# Patient Record
Sex: Male | Born: 1960 | Race: Black or African American | Hispanic: No | Marital: Married
Health system: Midwestern US, Academic
[De-identification: ages and names within clinical notes are randomized; demographics above are authoritative.]

## PROBLEM LIST (undated history)

## (undated) DIAGNOSIS — C140 Malignant neoplasm of pharynx, unspecified: Secondary | ICD-10-CM

## (undated) DIAGNOSIS — K4090 Unilateral inguinal hernia, without obstruction or gangrene, not specified as recurrent: Secondary | ICD-10-CM

## (undated) HISTORY — PX: APPENDECTOMY: SHX54

## (undated) HISTORY — PX: GASTROSTOMY W/ FEEDING TUBE: SUR642

## (undated) HISTORY — PX: OTHER SURGICAL HISTORY: SHX169

---

## 2018-05-01 NOTE — Pre-Procedure Instructions (Signed)
Ian Richard  05/01/2018      [place] [address] Phone: [phone] Fax: [phone]    Your procedure is scheduled on Friday March 13th.  Report to [facility] Admitting at 9:50 A.M.  Call this number if you have problems the morning of surgery:  [phone]   Remember:  Do not eat or drink after midnight.     Take these medicines the morning of surgery with A SIP OF WATER  acetaminophen (TYLENOL)  7 days prior to surgery STOP taking any Aspirin(unless otherwise instructed by your surgeon), Aleve, Naproxen, Ibuprofen, Motrin, Advil, Goody's, BC's, all herbal medications, fish oil, and all vitamins     Do not wear jewelry  Do not wear lotions, powders, or colognes, or deodorant.  Men may shave face and neck.  Do not bring valuables to the hospital.  [facility] is not responsible for any belongings or valuables.  Contacts, dentures or bridgework may not be worn into surgery.  Leave your suitcase in the car.  After surgery it may be brought to your room.  For patients admitted to the hospital, discharge time will be determined by your treatment team.  Patients discharged the day of surgery will not be allowed to drive home.   [employer]- Preparing For Surgery  Before surgery, you can play an important role. Because skin is not sterile, your skin needs to be as free of germs as possible. You can reduce the number of germs on your skin by washing with CHG (chlorahexidine gluconate) Soap before surgery.  CHG is an antiseptic cleaner which kills germs and bonds with the skin to continue killing germs even after washing.    Oral Hygiene is also important to reduce your risk of infection.  Remember - BRUSH YOUR TEETH THE MORNING OF SURGERY WITH YOUR REGULAR TOOTHPASTE  Please do not use if you have an allergy to CHG or antibacterial soaps. If your skin becomes  reddened/irritated stop using the CHG.  Do not shave (including legs and underarms) for at least 48 hours prior to first CHG shower. It is OK to shave your face.  Please follow these instructions carefully.   1. Shower the NIGHT BEFORE SURGERY and the MORNING OF SURGERY with CHG.   2. If you chose to wash your hair, wash your hair first as usual with your normal shampoo.  3. After you shampoo, rinse your hair and body thoroughly to remove the shampoo.  4. Use CHG as you would any other liquid soap. You can apply CHG directly to the skin and wash gently with a scrungie or a clean washcloth.   5. Apply the CHG Soap to your body ONLY FROM THE NECK DOWN.  Do not use on open wounds or open sores. Avoid contact with your eyes, ears, mouth and genitals (private parts). Wash Face and genitals (private parts)  with your normal soap.  6. Wash thoroughly, paying special attention to the area where your surgery will be performed.  7. Thoroughly rinse your body with warm water from the neck down.  8. DO NOT shower/wash with your normal soap after using and rinsing off the CHG Soap.  9. Pat yourself dry with a CLEAN TOWEL.  10. Wear CLEAN PAJAMAS to bed the night before surgery, wear comfortable clothes the morning of surgery  11. Place CLEAN SHEETS on your bed the night of your first  shower and DO NOT SLEEP WITH PETS.    Day of Surgery: Shower as stated above. Do not apply any deodorants/lotions.  Please wear clean clothes to the hospital/surgery center.   Remember to brush your teeth WITH YOUR REGULAR TOOTHPASTE.   Please read over the following fact sheets that you were given.

## 2018-05-02 ENCOUNTER — Encounter (HOSPITAL_COMMUNITY)
Admission: RE | Admit: 2018-05-02 | Discharge: 2018-05-02 | Disposition: A | Discharge status: Home / Self Care | Payer: Medicaid Other | Source: Ambulatory Visit | Attending: Oral Surgery | Admitting: Oral Surgery

## 2018-05-02 ENCOUNTER — Other Ambulatory Visit: Payer: Self-pay

## 2018-05-02 ENCOUNTER — Encounter (HOSPITAL_COMMUNITY): Payer: Self-pay

## 2018-05-02 DIAGNOSIS — Z01812 Encounter for preprocedural laboratory examination: Secondary | ICD-10-CM | POA: Diagnosis not present

## 2018-05-02 HISTORY — DX: Malignant neoplasm of pharynx, unspecified: C14.0

## 2018-05-02 HISTORY — DX: Unilateral inguinal hernia, without obstruction or gangrene, not specified as recurrent: K40.90

## 2018-05-02 LAB — CBC WITH DIFFERENTIAL/PLATELET
Abs Immature Granulocytes: 0.03 10*3/uL (ref 0.00–0.07)
Basophils Absolute: 0 10*3/uL (ref 0.0–0.1)
Basophils Relative: 0 %
Eosinophils Absolute: 0 10*3/uL (ref 0.0–0.5)
Eosinophils Relative: 0 %
HCT: 36.6 % — ABNORMAL LOW (ref 39.0–52.0)
Hemoglobin: 12.1 g/dL — ABNORMAL LOW (ref 13.0–17.0)
Immature Granulocytes: 0 %
Lymphocytes Relative: 14 %
Lymphs Abs: 1.7 10*3/uL (ref 0.7–4.0)
MCH: 30.9 pg (ref 26.0–34.0)
MCHC: 33.1 g/dL (ref 30.0–36.0)
MCV: 93.4 fL (ref 80.0–100.0)
Monocytes Absolute: 1.2 10*3/uL — ABNORMAL HIGH (ref 0.1–1.0)
Monocytes Relative: 10 %
Neutro Abs: 8.7 10*3/uL — ABNORMAL HIGH (ref 1.7–7.7)
Neutrophils Relative %: 76 %
Platelets: 451 10*3/uL — ABNORMAL HIGH (ref 150–400)
RBC: 3.92 MIL/uL — ABNORMAL LOW (ref 4.22–5.81)
RDW: 13.5 % (ref 11.5–15.5)
WBC: 11.6 10*3/uL — ABNORMAL HIGH (ref 4.0–10.5)
nRBC: 0 % (ref 0.0–0.2)

## 2018-05-02 LAB — COMPREHENSIVE METABOLIC PANEL
ALT: 14 U/L (ref 0–44)
AST: 19 U/L (ref 15–41)
Albumin: 3 g/dL — ABNORMAL LOW (ref 3.5–5.0)
Alkaline Phosphatase: 65 U/L (ref 38–126)
Anion gap: 8 (ref 5–15)
BUN: 15 mg/dL (ref 6–20)
CO2: 29 mmol/L (ref 22–32)
Calcium: 9.4 mg/dL (ref 8.9–10.3)
Chloride: 96 mmol/L — ABNORMAL LOW (ref 98–111)
Creatinine, Ser: 0.54 mg/dL — ABNORMAL LOW (ref 0.61–1.24)
GFR calc Af Amer: >60 mL/min (ref >60)
Glucose, Bld: 89 mg/dL (ref 70–99)
Potassium: 4 mmol/L (ref 3.5–5.1)
Sodium: 133 mmol/L — ABNORMAL LOW (ref 135–145)
Total Bilirubin: 0.2 mg/dL — ABNORMAL LOW (ref 0.3–1.2)
Total Protein: 7.5 g/dL (ref 6.5–8.1)

## 2018-05-02 LAB — PROTIME-INR
INR: 1.1 (ref 0.8–1.2)
Prothrombin Time: 13.7 seconds (ref 11.4–15.2)

## 2018-05-02 NOTE — Progress Notes (Signed)
PCP - NO ONE AT THE PRESENT  ENT - Dr. Pershing Proud, ENT  Cardiologist - DENIES  Chest x-ray - N/A EKG - N/A Stress Test - N/A ECHO - N/A Cardiac Cath - N/A  Sleep Study - NA/ CPAP -   Fasting Blood Sugar - N/A Checks Blood Sugar _____ times a day  Blood Thinner Instructions: N/A Aspirin Instructions:  Anesthesia review: NO  Patient denies shortness of breath, fever, cough and chest pain at PAT appointment   Patient verbalized understanding of instructions that were given to them at the PAT appointment. Patient was also instructed that they will need to review over the PAT instructions again at home before surgery.

## 2018-05-03 ENCOUNTER — Encounter (HOSPITAL_COMMUNITY)
Admission: RE | Disposition: A | Discharge status: Home / Self Care | Payer: Self-pay | Source: Home / Self Care | Attending: Oral Surgery

## 2018-05-03 ENCOUNTER — Ambulatory Visit (HOSPITAL_COMMUNITY): Payer: Medicaid Other | Admitting: Certified Registered Nurse Anesthetist

## 2018-05-03 ENCOUNTER — Encounter (HOSPITAL_COMMUNITY): Payer: Self-pay

## 2018-05-03 ENCOUNTER — Ambulatory Visit (HOSPITAL_COMMUNITY)
Admission: RE | Admit: 2018-05-03 | Discharge: 2018-05-03 | Disposition: A | Discharge status: Home / Self Care | Payer: Medicaid Other | Attending: Oral Surgery | Admitting: Oral Surgery

## 2018-05-03 DIAGNOSIS — C140 Malignant neoplasm of pharynx, unspecified: Secondary | ICD-10-CM | POA: Diagnosis not present

## 2018-05-03 DIAGNOSIS — K029 Dental caries, unspecified: Secondary | ICD-10-CM | POA: Insufficient documentation

## 2018-05-03 DIAGNOSIS — Z91030 Bee allergy status: Secondary | ICD-10-CM | POA: Diagnosis not present

## 2018-05-03 HISTORY — PX: TOOTH EXTRACTION: SHX859

## 2018-05-03 SURGERY — DENTAL RESTORATION/EXTRACTIONS
Anesthesia: General | Site: Mouth | Laterality: Bilateral

## 2018-05-03 MED ORDER — LACTATED RINGERS IV SOLN
INTRAVENOUS | Status: DC
  Administered 2018-05-03 (×2): via INTRAVENOUS

## 2018-05-03 MED ORDER — MIDAZOLAM HCL 2 MG/2ML IJ SOLN
INTRAMUSCULAR | Status: DC | PRN
  Administered 2018-05-03 (×2): 1 mg via INTRAVENOUS

## 2018-05-03 MED ORDER — OXYCODONE HCL 5 MG/5ML PO SOLN: 5.0000 mg | ORAL | 0 refills | Status: AC | PRN

## 2018-05-03 MED ORDER — HYDROMORPHONE HCL 1 MG/ML IJ SOLN
0.2500 mg | INTRAMUSCULAR | Status: DC | PRN
  Administered 2018-05-03: 0.5 mg via INTRAVENOUS

## 2018-05-03 MED ORDER — FENTANYL CITRATE (PF) 250 MCG/5ML IJ SOLN
INTRAMUSCULAR | Status: AC
  Filled 2018-05-03: qty 5

## 2018-05-03 MED ORDER — SUCCINYLCHOLINE CHLORIDE 200 MG/10ML IV SOSY
PREFILLED_SYRINGE | INTRAVENOUS | Status: AC
  Filled 2018-05-03: qty 30

## 2018-05-03 MED ORDER — LIDOCAINE 2% (20 MG/ML) 5 ML SYRINGE
INTRAMUSCULAR | Status: AC
  Filled 2018-05-03: qty 5

## 2018-05-03 MED ORDER — PROPOFOL 10 MG/ML IV BOLUS
INTRAVENOUS | Status: DC | PRN
  Administered 2018-05-03: 130 mg via INTRAVENOUS

## 2018-05-03 MED ORDER — ONDANSETRON HCL 4 MG/2ML IJ SOLN
INTRAMUSCULAR | Status: AC
  Filled 2018-05-03: qty 2

## 2018-05-03 MED ORDER — SODIUM CHLORIDE 0.9 % IV SOLN
INTRAVENOUS | Status: AC | PRN
  Administered 2018-05-03: 1000 mL

## 2018-05-03 MED ORDER — PHENYLEPHRINE 40 MCG/ML (10ML) SYRINGE FOR IV PUSH (FOR BLOOD PRESSURE SUPPORT)
PREFILLED_SYRINGE | INTRAVENOUS | Status: AC
  Filled 2018-05-03: qty 30

## 2018-05-03 MED ORDER — SUGAMMADEX SODIUM 200 MG/2ML IV SOLN
INTRAVENOUS | Status: DC | PRN
  Administered 2018-05-03: 200 mg via INTRAVENOUS

## 2018-05-03 MED ORDER — PHENYLEPHRINE 40 MCG/ML (10ML) SYRINGE FOR IV PUSH (FOR BLOOD PRESSURE SUPPORT)
PREFILLED_SYRINGE | INTRAVENOUS | Status: DC | PRN
  Administered 2018-05-03: 40 ug via INTRAVENOUS
  Administered 2018-05-03 (×2): 120 ug via INTRAVENOUS
  Administered 2018-05-03: 40 ug via INTRAVENOUS

## 2018-05-03 MED ORDER — CEFAZOLIN SODIUM-DEXTROSE 2-4 GM/100ML-% IV SOLN
2.0000 g | INTRAVENOUS | Status: AC
  Administered 2018-05-03: 2 g via INTRAVENOUS

## 2018-05-03 MED ORDER — 0.9 % SODIUM CHLORIDE (POUR BTL) OPTIME
TOPICAL | Status: DC | PRN
  Administered 2018-05-03: 1000 mL

## 2018-05-03 MED ORDER — CEFAZOLIN SODIUM-DEXTROSE 2-4 GM/100ML-% IV SOLN
INTRAVENOUS | Status: AC
  Filled 2018-05-03: qty 100

## 2018-05-03 MED ORDER — EPHEDRINE 5 MG/ML INJ
INTRAVENOUS | Status: AC
  Filled 2018-05-03: qty 10

## 2018-05-03 MED ORDER — ONDANSETRON HCL 4 MG/2ML IJ SOLN
INTRAMUSCULAR | Status: DC | PRN
  Administered 2018-05-03: 4 mg via INTRAVENOUS

## 2018-05-03 MED ORDER — DEXAMETHASONE SODIUM PHOSPHATE 10 MG/ML IJ SOLN
INTRAMUSCULAR | Status: AC
  Filled 2018-05-03: qty 1

## 2018-05-03 MED ORDER — HYDROMORPHONE HCL 1 MG/ML IJ SOLN
INTRAMUSCULAR | Status: AC
  Filled 2018-05-03: qty 1

## 2018-05-03 MED ORDER — HYDROCODONE-ACETAMINOPHEN 7.5-325 MG PO TABS: 1.0000 | ORAL_TABLET | Freq: Once | ORAL | Status: DC | PRN

## 2018-05-03 MED ORDER — PROPOFOL 10 MG/ML IV BOLUS
INTRAVENOUS | Status: AC
  Filled 2018-05-03: qty 20

## 2018-05-03 MED ORDER — MEPERIDINE HCL 50 MG/ML IJ SOLN: 6.2500 mg | INTRAMUSCULAR | Status: DC | PRN

## 2018-05-03 MED ORDER — AMOXICILLIN 400 MG/5ML PO SUSR: 400.0000 mg | Freq: Two times a day (BID) | ORAL | 0 refills | Status: AC

## 2018-05-03 MED ORDER — ROCURONIUM BROMIDE 10 MG/ML (PF) SYRINGE
PREFILLED_SYRINGE | INTRAVENOUS | Status: DC | PRN
  Administered 2018-05-03: 30 mg via INTRAVENOUS

## 2018-05-03 MED ORDER — DEXAMETHASONE SODIUM PHOSPHATE 10 MG/ML IJ SOLN
INTRAMUSCULAR | Status: DC | PRN
  Administered 2018-05-03: 10 mg via INTRAVENOUS

## 2018-05-03 MED ORDER — FENTANYL CITRATE (PF) 250 MCG/5ML IJ SOLN
INTRAMUSCULAR | Status: DC | PRN
  Administered 2018-05-03: 50 ug via INTRAVENOUS

## 2018-05-03 MED ORDER — LIDOCAINE-EPINEPHRINE 2 %-1:100000 IJ SOLN
INTRAMUSCULAR | Status: DC | PRN
  Administered 2018-05-03: 16 mL

## 2018-05-03 MED ORDER — LIDOCAINE 2% (20 MG/ML) 5 ML SYRINGE
INTRAMUSCULAR | Status: DC | PRN
  Administered 2018-05-03: 100 mg via INTRAVENOUS

## 2018-05-03 MED ORDER — OXYCODONE-ACETAMINOPHEN 5-325 MG/5ML PO SOLN: 5.0000 mL | ORAL | 0 refills | Status: AC | PRN

## 2018-05-03 MED ORDER — ACETAMINOPHEN 10 MG/ML IV SOLN: 1000.0000 mg | Freq: Once | INTRAVENOUS | Status: DC | PRN

## 2018-05-03 MED ORDER — AMOXICILLIN 500 MG PO CAPS: 500.0000 mg | ORAL_CAPSULE | Freq: Three times a day (TID) | ORAL | 0 refills | Status: AC

## 2018-05-03 MED ORDER — EPHEDRINE SULFATE 50 MG/ML IJ SOLN
INTRAMUSCULAR | Status: DC | PRN
  Administered 2018-05-03: 10 mg via INTRAVENOUS

## 2018-05-03 MED ORDER — MIDAZOLAM HCL 2 MG/2ML IJ SOLN
INTRAMUSCULAR | Status: AC
  Filled 2018-05-03: qty 2

## 2018-05-03 MED ORDER — LIDOCAINE-EPINEPHRINE 2 %-1:100000 IJ SOLN
INTRAMUSCULAR | Status: AC
  Filled 2018-05-03: qty 1

## 2018-05-03 MED ORDER — PROMETHAZINE HCL 25 MG/ML IJ SOLN: 6.2500 mg | INTRAMUSCULAR | Status: DC | PRN

## 2018-05-03 MED ORDER — OXYCODONE-ACETAMINOPHEN 5-325 MG PO TABS: 1.0000 | ORAL_TABLET | Freq: Four times a day (QID) | ORAL | 0 refills | Status: DC | PRN

## 2018-05-03 SURGICAL SUPPLY — 40 items
BLADE 15 SAFETY STRL DISP (BLADE) ×3 IMPLANT
BLADE SURG 15 STRL LF DISP TIS (BLADE) ×1 IMPLANT
BLADE SURG 15 STRL SS (BLADE) ×2
BUR CROSS CUT FISSURE 1.6 (BURR) ×2 IMPLANT
BUR CROSS CUT FISSURE 1.6MM (BURR) ×1
BUR EGG ELITE 4.0 (BURR) ×2 IMPLANT
BUR EGG ELITE 4.0MM (BURR) ×1
CANISTER SUCT 3000ML PPV (MISCELLANEOUS) ×3 IMPLANT
COVER SURGICAL LIGHT HANDLE (MISCELLANEOUS) ×3 IMPLANT
COVER WAND RF STERILE (DRAPES) ×3 IMPLANT
DECANTER SPIKE VIAL GLASS SM (MISCELLANEOUS) ×3 IMPLANT
DRAPE U-SHAPE 76X120 STRL (DRAPES) ×3 IMPLANT
GAUZE PACKING FOLDED 2  STR (GAUZE/BANDAGES/DRESSINGS) ×2
GAUZE PACKING FOLDED 2 STR (GAUZE/BANDAGES/DRESSINGS) ×1 IMPLANT
GLOVE BIO SURGEON STRL SZ 6.5 (GLOVE) IMPLANT
GLOVE BIO SURGEON STRL SZ7 (GLOVE) IMPLANT
GLOVE BIO SURGEON STRL SZ7.5 (GLOVE) ×3 IMPLANT
GLOVE BIO SURGEONS STRL SZ 6.5 (GLOVE)
GLOVE BIOGEL PI IND STRL 6.5 (GLOVE) IMPLANT
GLOVE BIOGEL PI IND STRL 7.0 (GLOVE) IMPLANT
GLOVE BIOGEL PI INDICATOR 6.5 (GLOVE)
GLOVE BIOGEL PI INDICATOR 7.0 (GLOVE)
GOWN STRL REUS W/ TWL LRG LVL3 (GOWN DISPOSABLE) ×1 IMPLANT
GOWN STRL REUS W/ TWL XL LVL3 (GOWN DISPOSABLE) ×1 IMPLANT
GOWN STRL REUS W/TWL LRG LVL3 (GOWN DISPOSABLE) ×2
GOWN STRL REUS W/TWL XL LVL3 (GOWN DISPOSABLE) ×2
IV NS 1000ML (IV SOLUTION) ×2
IV NS 1000ML BAXH (IV SOLUTION) ×1 IMPLANT
KIT BASIN OR (CUSTOM PROCEDURE TRAY) ×3 IMPLANT
KIT TURNOVER KIT B (KITS) ×3 IMPLANT
NEEDLE 22X1 1/2 (OR ONLY) (NEEDLE) ×6 IMPLANT
NS IRRIG 1000ML POUR BTL (IV SOLUTION) ×3 IMPLANT
PAD ARMBOARD 7.5X6 YLW CONV (MISCELLANEOUS) ×3 IMPLANT
SLEEVE IRRIGATION ELITE 7 (MISCELLANEOUS) ×3 IMPLANT
SPONGE SURGIFOAM ABS GEL 12-7 (HEMOSTASIS) IMPLANT
SUT CHROMIC 3 0 PS 2 (SUTURE) ×3 IMPLANT
SYR CONTROL 10ML LL (SYRINGE) ×3 IMPLANT
TRAY ENT MC OR (CUSTOM PROCEDURE TRAY) ×3 IMPLANT
TUBING IRRIGATION (MISCELLANEOUS) ×3 IMPLANT
YANKAUER SUCT BULB TIP NO VENT (SUCTIONS) ×3 IMPLANT

## 2018-05-03 NOTE — Anesthesia Procedure Notes (Signed)
Procedure Name: Intubation Date/Time: 05/03/2018 12:28 PM Performed by: Kyung Rudd, CRNA Pre-anesthesia Checklist: Patient identified, Emergency Drugs available, Suction available and Patient being monitored Patient Re-evaluated:Patient Re-evaluated prior to induction Oxygen Delivery Method: Circle system utilized Preoxygenation: Pre-oxygenation with 100% oxygen Induction Type: IV induction Ventilation: Mask ventilation without difficulty and Oral airway inserted - appropriate to patient size Laryngoscope Size: Glidescope and 4 Tube type: Oral Tube size: 7.0 mm Number of attempts: 1 Airway Equipment and Method: Stylet Placement Confirmation: ETT inserted through vocal cords under direct vision,  positive ETCO2 and breath sounds checked- equal and bilateral Secured at: 22 cm Tube secured with: Tape Dental Injury: Teeth and Oropharynx as per pre-operative assessment

## 2018-05-03 NOTE — Anesthesia Preprocedure Evaluation (Signed)
Anesthesia Evaluation  Patient identified by MRN, date of birth, ID band Patient awake    Reviewed: Allergy & Precautions, NPO status , Patient's Chart, lab work & pertinent test results  Airway Mallampati: IV  TM Distance: <3 FB Neck ROM: Full    Dental no notable dental hx. (+) Teeth Intact, Poor Dentition   Pulmonary former smoker,    Pulmonary exam normal breath sounds clear to auscultation       Cardiovascular negative cardio ROS Normal cardiovascular exam Rhythm:Regular Rate:Normal     Neuro/Psych    GI/Hepatic Neg liver ROS,   Endo/Other    Renal/GU      Musculoskeletal   Abdominal   Peds  Hematology   Anesthesia Other Findings   Reproductive/Obstetrics                            Lab Results  Component Value Date   WBC 11.6 (H) 05/02/2018   HGB 12.1 (L) 05/02/2018   HCT 36.6 (L) 05/02/2018   MCV 93.4 05/02/2018   PLT 451 (H) 05/02/2018    Anesthesia Physical Anesthesia Plan  ASA: III  Anesthesia Plan: General   Post-op Pain Management:    Induction: Intravenous  PONV Risk Score and Plan: Treatment may vary due to age or medical condition and Ondansetron  Airway Management Planned: Video Laryngoscope Planned and Oral ETT  Additional Equipment:   Intra-op Plan:   Post-operative Plan: Extubation in OR  Informed Consent: I have reviewed the patients History and Physical, chart, labs and discussed the procedure including the risks, benefits and alternatives for the proposed anesthesia with the patient or authorized representative who has indicated his/her understanding and acceptance.     Dental advisory given  Plan Discussed with: CRNA  Anesthesia Plan Comments:         Anesthesia Quick Evaluation

## 2018-05-03 NOTE — Anesthesia Postprocedure Evaluation (Signed)
Anesthesia Post Note  Patient: Ian Richard  Procedure(s) Performed: MULTIPLE EXTRACTIONS (Bilateral Mouth)     Patient location during evaluation: PACU Anesthesia Type: General Level of consciousness: awake and alert Pain management: pain level controlled Vital Signs Assessment: post-procedure vital signs reviewed and stable Respiratory status: spontaneous breathing, nonlabored ventilation and respiratory function stable Cardiovascular status: blood pressure returned to baseline and stable Postop Assessment: no apparent nausea or vomiting Anesthetic complications: no    Last Vitals:  Vitals:   05/03/18 1348 05/03/18 1403  BP: 115/71 102/67  Pulse: 83 73  Resp: 17 13  Temp:    SpO2: 99% 100%    Last Pain:  Vitals:   05/03/18 1403  TempSrc:   PainSc: 6                  Cherylene Ferrufino,W. EDMOND

## 2018-05-03 NOTE — H&P (Signed)
HISTORY AND PHYSICAL  Ian Richard is a 58 y.o. male patient with CC: referred by general dentist for dental extractions prior to radiation treatment for throat cancer.  No diagnosis found.  Past Medical History:  Diagnosis Date  . Left inguinal hernia   . Throat cancer ([place])     No current facility-administered medications for this encounter.    Allergies  Allergen Reactions  . Bee Venom Anaphylaxis  . Hornet Venom Anaphylaxis   Active Problems:   * No active hospital problems. *  Vitals: Blood pressure 120/79, pulse 84, temperature 98.2 F (36.8 C), temperature source Oral, resp. rate 18, height 5\' 8"  (1.727 m), weight 50.8 kg, SpO2 100 %. Lab results: Results for orders placed or performed during the hospital encounter of 05/02/18 (from the past 24 hour(s))  CBC WITH DIFFERENTIAL     Status: Abnormal   Collection Time: 05/02/18  4:11 PM  Result Value Ref Range   WBC 11.6 (H) 4.0 - 10.5 K/uL   RBC 3.92 (L) 4.22 - 5.81 MIL/uL   Hemoglobin 12.1 (L) 13.0 - 17.0 g/dL   HCT 36.6 (L) 39.0 - 52.0 %   MCV 93.4 80.0 - 100.0 fL   MCH 30.9 26.0 - 34.0 pg   MCHC 33.1 30.0 - 36.0 g/dL   RDW 13.5 11.5 - 15.5 %   Platelets 451 (H) 150 - 400 K/uL   nRBC 0.0 0.0 - 0.2 %   Neutrophils Relative % 76 %   Neutro Abs 8.7 (H) 1.7 - 7.7 K/uL   Lymphocytes Relative 14 %   Lymphs Abs 1.7 0.7 - 4.0 K/uL   Monocytes Relative 10 %   Monocytes Absolute 1.2 (H) 0.1 - 1.0 K/uL   Eosinophils Relative 0 %   Eosinophils Absolute 0.0 0.0 - 0.5 K/uL   Basophils Relative 0 %   Basophils Absolute 0.0 0.0 - 0.1 K/uL   Immature Granulocytes 0 %   Abs Immature Granulocytes 0.03 0.00 - 0.07 K/uL  Comprehensive metabolic panel     Status: Abnormal   Collection Time: 05/02/18  4:11 PM  Result Value Ref Range   Sodium 133 (L) 135 - 145 mmol/L   Potassium 4.0 3.5 - 5.1 mmol/L   Chloride 96 (L) 98 - 111 mmol/L   CO2 29 22 - 32 mmol/L   Glucose, Bld 89 70 - 99 mg/dL   BUN 15 6 - 20 mg/dL   Creatinine,  Ser 0.54 (L) 0.61 - 1.24 mg/dL   Calcium 9.4 8.9 - 10.3 mg/dL   Total Protein 7.5 6.5 - 8.1 g/dL   Albumin 3.0 (L) 3.5 - 5.0 g/dL   AST 19 15 - 41 U/L   ALT 14 0 - 44 U/L   Alkaline Phosphatase 65 38 - 126 U/L   Total Bilirubin 0.2 (L) 0.3 - 1.2 mg/dL   GFR calc non Af Amer >60 >60 mL/min   GFR calc Af Amer >60 >60 mL/min   Anion gap 8 5 - 15  Protime-INR     Status: None   Collection Time: 05/02/18  4:11 PM  Result Value Ref Range   Prothrombin Time 13.7 11.4 - 15.2 seconds   INR 1.1 0.8 - 1.2   Radiology Results: No results found. General appearance: alert, cooperative and no distress Head: Normocephalic, without obvious abnormality, atraumatic Eyes: negative Nose: Nares normal. Septum midline. Mucosa normal. No drainage or sinus tenderness. Throat: multiple dental caries maxillary teeth # 1-7, 9-16. impacted root # 17. No purulence,  fluctuance. Mixed red/white lesion posterior pharynx.  Neck: no adenopathy, supple, symmetrical, trachea midline and thyroid not enlarged, symmetric, no tenderness/mass/nodules Resp: clear to auscultation bilaterally Cardio: regular rate and rhythm, S1, S2 normal, no murmur, click, rub or gallop  Assessment:57  Yo Male with throat cancer, multiple non-restorable teeth. Needs extractions prior to radiation therapy of cancer.   Plan: Extract 1-7, 9-17. GA. Day surgery.    Diona Browner 05/03/2018

## 2018-05-03 NOTE — Op Note (Signed)
NAME: Ian Richard, Ian Richard MEDICAL RECORD PI:[id number] ACCOUNT [id number] DATE OF BIRTH:1961/02/20 FACILITY: [facility] LOCATION: [facility] PHYSICIAN:Doreen Garretson M. Ahmere Hemenway, DDS  OPERATIVE REPORT  DATE OF PROCEDURE:  05/03/2018  PREOPERATIVE DIAGNOSIS:  Nonrestorable teeth numbers 1, 2, 3, 4, 5, 6, 7, 8, 9, 10, 11, 12, 13, 14, 15, 16, 17, throat cancer preradiation therapy.  POSTOPERATIVE DIAGNOSES:  Nonrestorable teeth numbers 1, 2, 3, 4, 5, 6, 7, 8, 9, 10, 11, 12, 13, 14, 15, 16, 17, throat cancer preradiation therapy procedures.  No tooth 1 present.    PROCEDURE:  Extractions of teeth numbers 1, 2, 3, 4, 5, 6, 7, 9, 10, 11, 12, 13, 14, 15, 16, 17, alveoplasty right and left maxilla.  SURGEON:  Diona Browner, DDS  ANESTHESIA:  General oral intubation, Dr. Valma Cava attending.  DESCRIPTION OF PROCEDURE:  The patient was taken to the operating room and placed on the table in supine position.  General anesthesia was administered intravenously and an oral endotracheal tube was placed and marked.  The eyes were protected.  The  patient was draped for surgery.  A timeout was performed.  The posterior pharynx was suctioned and a throat pack was placed.  An obvious mass lesion in the posterior pharynx was noted when the throat pack was placed, 2% lidocaine, 1:100,000 epinephrine  was infiltrated in an inferior alveolar block on the left side, and buccal and palatal infiltration in the maxilla around the teeth to be removed.  A total of 19 mL was utilized.  A bite block was placed on the right side of the mouth and a sweetheart  retractor was used to retract the tongue.  A #15 blade was used to make an incision around tooth 17.  The tooth was elevated and removed with the dental forceps.  A portion of the tooth fractured and this required use of a Stryker handpiece with the  drill to remove the root fragment.  Then, the area was irrigated.  The periosteum was released to allow for primary closure and the area was  closed with 3-0 chromic.  In the maxilla, a #15 blade was used to make an incision at the distal aspect of tooth  15 and this incision was carried in the gingival sulcus both buccally and palatally until tooth 9 was reached.  The periosteum was reflected from around these teeth.  Then, the teeth were elevated with a 301 elevator and teeth 16 and 14 and 10 and 9 were  removed with the dental forceps.  Teeth 15, 12, 11 and 13 required removal of additional bone to remove frank portions of the teeth as they broke when attempted to extract with a dental forceps.  After the teeth in the left maxilla were removed, the  periosteum was reflected to expose the alveolar crest.  Alveoplasty was performed using an egg-shaped bur and bone file.  Then, the area was approximated, releasing periosteal incisions were made such that primary closure could be achieved in the left  maxilla was closed with 3-0 chromic.  The endotracheal tube was repositioned to the other side of the mouth as was the bite block and sweetheart retractor then a #15 blade was used to make an incision around teeth numbers 2, 3, 4, 5, 6 and 7.  No tooth 1  was noted.  The periosteum was reflected and the teeth were elevated with a 301 elevator.  Teeth numbers 2 and 3 were removed with the forceps.  Teeth numbers 4, 5 and 6, fractured upon attempted  removal so additional bone was removed circumferentially  around the teeth and the remaining roots so these teeth could be removed.  Tooth 7 was removed with the rongeurs.  The periosteum was reflected to expose the alveolar crest and then alveoplasty was performed using an egg-shaped bur followed by the bone  file.  Then, the area was irrigated.  The periosteum was released and primary closure was achieved using 3-0 chromic.  Then, the oral cavity was irrigated and suctioned.  The patient was left in care of anesthesia for extubation and transport to recovery  room with plans for discharge home through day  surgery.  ESTIMATED BLOOD LOSS:  Minimal.  COMPLICATIONS:  None.  SPECIMENS:  None.  TN/NUANCE  D:05/03/2018 T:05/03/2018 JOB:[id number]

## 2018-05-03 NOTE — Transfer of Care (Signed)
Immediate Anesthesia Transfer of Care Note  Patient: Ian Richard  Procedure(s) Performed: MULTIPLE EXTRACTIONS (Bilateral Mouth)  Patient Location: PACU  Anesthesia Type:General  Level of Consciousness: awake and alert   Airway & Oxygen Therapy: Patient Spontanous Breathing and Patient connected to face mask oxygen  Post-op Assessment: Report given to RN and Post -op Vital signs reviewed and stable  Post vital signs: Reviewed and stable  Last Vitals:  Vitals Value Taken Time  BP 119/76 05/03/2018  1:33 PM  Temp    Pulse 80 05/03/2018  1:36 PM  Resp 15 05/03/2018  1:36 PM  SpO2 100 % 05/03/2018  1:36 PM  Vitals shown include unvalidated device data.  Last Pain:  Vitals:   05/03/18 1053  TempSrc:   PainSc: 0-No pain      Patients Stated Pain Goal: 2 (02/72/53 6644)  Complications: No apparent anesthesia complications

## 2018-05-03 NOTE — Op Note (Signed)
05/03/2018  1:21 PM  PATIENT:  Chanetta Marshall  58 y.o. male  PRE-OPERATIVE DIAGNOSIS:  NON RESTORABLE teeth 1, 2, 3, 4, 5, 6, 7, 9, 10, 11, 12, 13, 14, 15, 16, 17 throat canceer  POST-OPERATIVE DIAGNOSIS:  SAME + no tooth # 1  present  PROCEDURE:  Procedure(s): MULTIPLE EXTRACTIONS  SURGEON:  Surgeon(s): Diona Browner, DDS  ANESTHESIA:   local and general  EBL:  minimal  DRAINS: none   SPECIMEN:  No Specimen  COUNTS:  YES  PLAN OF CARE: Discharge to home after PACU  PATIENT DISPOSITION:  PACU - hemodynamically stable.   PROCEDURE DETAILS: Dictation #[id number]  Gae Bon, DMD 05/03/2018 1:21 PM

## 2018-05-04 ENCOUNTER — Encounter (HOSPITAL_COMMUNITY): Payer: Self-pay | Admitting: Oral Surgery

## 2018-07-06 ENCOUNTER — Encounter (HOSPITAL_BASED_OUTPATIENT_CLINIC_OR_DEPARTMENT_OTHER): Payer: Self-pay | Admitting: Emergency Medicine

## 2018-07-06 ENCOUNTER — Other Ambulatory Visit: Payer: Self-pay

## 2018-07-06 ENCOUNTER — Emergency Department (HOSPITAL_BASED_OUTPATIENT_CLINIC_OR_DEPARTMENT_OTHER)
Admission: EM | Admit: 2018-07-06 | Discharge: 2018-07-07 | Disposition: A | Discharge status: Home / Self Care | Payer: Medicaid Other | Attending: Emergency Medicine | Admitting: Emergency Medicine

## 2018-07-06 ENCOUNTER — Emergency Department (HOSPITAL_BASED_OUTPATIENT_CLINIC_OR_DEPARTMENT_OTHER): Payer: Medicaid Other

## 2018-07-06 DIAGNOSIS — Z87891 Personal history of nicotine dependence: Secondary | ICD-10-CM | POA: Insufficient documentation

## 2018-07-06 DIAGNOSIS — Z79899 Other long term (current) drug therapy: Secondary | ICD-10-CM | POA: Insufficient documentation

## 2018-07-06 DIAGNOSIS — Z931 Gastrostomy status: Secondary | ICD-10-CM | POA: Diagnosis not present

## 2018-07-06 DIAGNOSIS — R2243 Localized swelling, mass and lump, lower limb, bilateral: Secondary | ICD-10-CM | POA: Diagnosis not present

## 2018-07-06 DIAGNOSIS — R600 Localized edema: Secondary | ICD-10-CM

## 2018-07-06 LAB — CBC WITH DIFFERENTIAL/PLATELET
Abs Immature Granulocytes: 0.29 10*3/uL — ABNORMAL HIGH (ref 0.00–0.07)
Basophils Absolute: 0 10*3/uL (ref 0.0–0.1)
Basophils Relative: 0 %
Eosinophils Absolute: 0.1 10*3/uL (ref 0.0–0.5)
Eosinophils Relative: 2 %
HCT: 20.6 % — ABNORMAL LOW (ref 39.0–52.0)
Hemoglobin: 6.6 g/dL — CL (ref 13.0–17.0)
Immature Granulocytes: 4 %
Lymphocytes Relative: 6 %
Lymphs Abs: 0.4 10*3/uL — ABNORMAL LOW (ref 0.7–4.0)
MCH: 29.6 pg (ref 26.0–34.0)
MCHC: 32 g/dL (ref 30.0–36.0)
MCV: 92.4 fL (ref 80.0–100.0)
Monocytes Absolute: 0.8 10*3/uL (ref 0.1–1.0)
Monocytes Relative: 12 %
Neutro Abs: 5.1 10*3/uL (ref 1.7–7.7)
Neutrophils Relative %: 76 %
Platelets: 256 10*3/uL (ref 150–400)
RBC: 2.23 MIL/uL — ABNORMAL LOW (ref 4.22–5.81)
RDW: 14.5 % (ref 11.5–15.5)
WBC: 6.8 10*3/uL (ref 4.0–10.5)
nRBC: 0 % (ref 0.0–0.2)

## 2018-07-06 LAB — COMPREHENSIVE METABOLIC PANEL
ALT: 13 U/L (ref 0–44)
AST: 18 U/L (ref 15–41)
Albumin: 2 g/dL — ABNORMAL LOW (ref 3.5–5.0)
Alkaline Phosphatase: 55 U/L (ref 38–126)
Anion gap: 7 (ref 5–15)
BUN: 9 mg/dL (ref 6–20)
CO2: 21 mmol/L — ABNORMAL LOW (ref 22–32)
Calcium: 7.1 mg/dL — ABNORMAL LOW (ref 8.9–10.3)
Chloride: 107 mmol/L (ref 98–111)
Creatinine, Ser: 0.6 mg/dL — ABNORMAL LOW (ref 0.61–1.24)
GFR calc Af Amer: >60 mL/min (ref >60)
GFR calc non Af Amer: >60 mL/min (ref >60)
Glucose, Bld: 72 mg/dL (ref 70–99)
Potassium: 2.6 mmol/L — CL (ref 3.5–5.1)
Sodium: 135 mmol/L (ref 135–145)
Total Bilirubin: 0.6 mg/dL (ref 0.3–1.2)
Total Protein: 4.7 g/dL — ABNORMAL LOW (ref 6.5–8.1)

## 2018-07-06 LAB — MAGNESIUM: Magnesium: 1.2 mg/dL — ABNORMAL LOW (ref 1.7–2.4)

## 2018-07-06 MED ORDER — GENERIC EXTERNAL MEDICATION
1.00 | Status: DC
Start: 2018-07-06 — End: 2018-07-06

## 2018-07-06 MED ORDER — GLYCOPYRROLATE 0.2 MG/ML IJ SOLN
0.50 | INTRAMUSCULAR | Status: DC
Start: ? — End: 2018-07-06

## 2018-07-06 MED ORDER — GENERIC EXTERNAL MEDICATION
Status: DC
Start: 2018-07-05 — End: 2018-07-06

## 2018-07-06 MED ORDER — POTASSIUM CHLORIDE CRYS ER 20 MEQ PO TBCR: 40.0000 meq | EXTENDED_RELEASE_TABLET | Freq: Once | ORAL | Status: DC

## 2018-07-06 MED ORDER — ONDANSETRON HCL 4 MG/2ML IJ SOLN
4.00 | INTRAMUSCULAR | Status: DC
Start: ? — End: 2018-07-06

## 2018-07-06 MED ORDER — OXYCODONE HCL 5 MG/5ML PO SOLN
10.00 | ORAL | Status: DC
Start: ? — End: 2018-07-06

## 2018-07-06 MED ORDER — HEPARIN SODIUM LOCK FLUSH 100 UNIT/ML IV SOLN
500.00 | INTRAVENOUS | Status: DC
Start: ? — End: 2018-07-06

## 2018-07-06 MED ORDER — GENERIC EXTERNAL MEDICATION
5.00 | Status: DC
Start: 2018-07-05 — End: 2018-07-06

## 2018-07-06 MED ORDER — ACETAMINOPHEN 325 MG PO TABS
650.00 | ORAL_TABLET | ORAL | Status: DC
Start: ? — End: 2018-07-06

## 2018-07-06 MED ORDER — ENOXAPARIN SODIUM 40 MG/0.4ML ~~LOC~~ SOLN
40.00 | SUBCUTANEOUS | Status: DC
Start: 2018-07-05 — End: 2018-07-06

## 2018-07-06 MED ORDER — SODIUM CHLORIDE 1 G PO TABS
1000.00 | ORAL_TABLET | ORAL | Status: DC
Start: 2018-07-05 — End: 2018-07-06

## 2018-07-06 MED ORDER — FUROSEMIDE 10 MG/ML IJ SOLN
40.0000 mg | Freq: Once | INTRAMUSCULAR | Status: AC
  Administered 2018-07-07: 01:00:00 40 mg via INTRAVENOUS
  Filled 2018-07-06: qty 4

## 2018-07-06 MED ORDER — SODIUM CHLORIDE 0.9 % IV SOLN
INTRAVENOUS | Status: DC
Start: ? — End: 2018-07-06

## 2018-07-06 MED ORDER — POTASSIUM CHLORIDE 20 MEQ PO PACK
20.00 | PACK | ORAL | Status: DC
Start: 2018-07-06 — End: 2018-07-06

## 2018-07-06 MED ORDER — FERROUS SULFATE 300 (60 FE) MG/5ML PO SYRP
300.00 | ORAL_SOLUTION | ORAL | Status: DC
Start: 2018-07-05 — End: 2018-07-06

## 2018-07-06 MED ORDER — ALBUTEROL SULFATE HFA 108 (90 BASE) MCG/ACT IN AERS
2.00 | INHALATION_SPRAY | RESPIRATORY_TRACT | Status: DC
Start: ? — End: 2018-07-06

## 2018-07-06 MED ORDER — GENERIC EXTERNAL MEDICATION
3.38 | Status: DC
Start: 2018-07-05 — End: 2018-07-06

## 2018-07-06 MED ORDER — MAGNESIUM HYDROXIDE 400 MG/5ML PO SUSP
30.00 | ORAL | Status: DC
Start: 2018-07-06 — End: 2018-07-06

## 2018-07-06 NOTE — ED Notes (Signed)
Pt nonverbal at this time- pt able to write to communicate. Pt sister at bedside to help with communication.

## 2018-07-06 NOTE — ED Notes (Signed)
Lab called and states hgb of 6.6 Dr. Ralene Bathe aware.

## 2018-07-06 NOTE — ED Notes (Signed)
Pt has feeding tube in place. Does not take anything orally at this time.

## 2018-07-06 NOTE — ED Provider Notes (Addendum)
Care assumed from Dr. Ralene Bathe at shift change.    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  Patient was recently admitted to [facility] for sepsis presumed related to aspiration pneumonia.  He received significant quantities of IV fluids while there.  He was discharged yesterday, then developed swelling in his feet this evening.  He is here today for the swelling in both feet.  Care signed out to me awaiting for chest x-ray and laboratory studies.  He returned with a potassium of 2.6 and hemoglobin of 6.6.  He also has 3+ pitting edema of both legs.  I presume the anemia and edema to be related to the quantities of IV fluids received while in the hospital.  He will be given IV Lasix here, then discharged at his request.  Patient is adamant about not going back into the hospital.  He tells me he has an appointment on Monday to see his oncologist and will follow-up then.  Patient understands that his hemoglobin and potassium are both lower than at discharge, but is insistent upon going home.  Patient has potassium at home and will increase his dose from 40 mg twice daily to 3 times daily.  I also offered to give IV potassium here, however the patient is refusing this and again wants to leave.  Patient also asking for medication for pain.  [employer] reviewed.  Patient has been receiving oxycodone solution in the past, most recently 10 days ago.  He will be given 100 cc of oxycodone solution which he can use 5 cc every 4 hours as needed.   Veryl Speak, MD 07/07/18 0000    Veryl Speak, MD 07/07/18 [phone]

## 2018-07-06 NOTE — ED Triage Notes (Signed)
Pt reports for bilateral foot pain and swelling that started approx 1500. Denies fever. Recent admission to [facility] on 07-01-2018

## 2018-07-06 NOTE — ED Notes (Signed)
Pt placed on monitor.  

## 2018-07-06 NOTE — ED Notes (Signed)
Pt port accessed. Sterile technique used. Blood return noted. Pt tolerated well.  

## 2018-07-06 NOTE — ED Notes (Signed)
K 2.6 Dr. Ralene Bathe aware and EKG ordered.

## 2018-07-06 NOTE — ED Provider Notes (Signed)
[facility]ERG[facility] DEPARTMENT Provider Note   CSN: [id number] Arrival date & time: 07/06/18  2202    History   Chief Complaint Chief Complaint  Patient presents with  . Foot Swelling    HPI Ian Richard is a 58 y.o. male.     The history is provided by the patient and medical records. No language interpreter was used.   Ian Richard is a 58 y.o. male who presents to the Emergency Department complaining of foot swelling. He presents to the emergency department accompanied by his sister for evaluation of foot swelling that developed when he awoke at 3 PM this afternoon. He was recently admitted to [facility] from Monday through Friday and discharged home yesterday. He was admitted for sepsis, aspiration pneumonia and electrolyte derangement. He was on IV fluids throughout the course of his admission. When he was at home he was sleeping sitting up, and this is his normal position to sleep to help prevent aspiration. He denies any pain unless palpated, chest pain, fevers, difficulty breathing. He is taking his medications as prescribed. He is currently undergoing radiation therapy to his neck. Symptoms are moderate to severe and constant in nature. Past Medical History:  Diagnosis Date  . Left inguinal hernia   . Throat cancer ([place])     There are no active problems to display for this patient.   Past Surgical History:  Procedure Laterality Date  . APPENDECTOMY    . GASTROSTOMY W/ FEEDING TUBE    . THROAT BX    . TOOTH EXTRACTION Bilateral 05/03/2018   Procedure: MULTIPLE EXTRACTIONS;  Surgeon: Diona Browner, DDS;  Location: [facility];  Service: Oral Surgery;  Laterality: Bilateral;  MULTIPLE EXTRACTIONS        Home Medications    Prior to Admission medications   Medication Sig Start Date End Date Taking? Authorizing Provider  magnesium hydroxide (MILK OF MAGNESIA) 400 MG/5ML suspension Take by mouth daily as needed for mild constipation.    Yes [provider]  ondansetron (ZOFRAN) 8 MG tablet Take by mouth every 8 (eight) hours as needed for nausea or vomiting.   Yes [provider]  potassium chloride SA (K-DUR) 20 MEQ tablet Take 20 mEq by mouth 2 (two) times daily.   Yes [provider]  acetaminophen (TYLENOL) 500 MG tablet Place 500-1,000 mg into feeding tube every 6 (six) hours as needed.    [provider]  amoxicillin (AMOXIL) 400 MG/5ML suspension Take 5 mLs (400 mg total) by mouth 2 (two) times daily. 05/03/18   Diona Browner, DDS  amoxicillin (AMOXIL) 500 MG capsule Take 1 capsule (500 mg total) by mouth 3 (three) times daily. 05/03/18   Diona Browner, DDS  Multiple Vitamin (MULTIVITAMIN WITH MINERALS) TABS tablet Place 1 tablet into feeding tube daily.    [provider]  oxyCODONE-acetaminophen (PERCOCET) 5-325 MG tablet Take 1 tablet by mouth every 6 (six) hours as needed for severe pain. 05/03/18   Diona Browner, DDS    Family History No family history on file.  Social History Social History   Tobacco Use  . Smoking status: Former Smoker    Last attempt to quit: 04/03/2018    Years since quitting: 0.2  . Smokeless tobacco: Former Network engineer Use Topics  . Alcohol use: Never    Frequency: Never  . Drug use: Never     Allergies   Bee venom and Hornet venom   Review of Systems Review  of Systems  All other systems reviewed and are negative.    Physical Exam Updated Vital Signs BP 120/82   Pulse 86   Temp 98.5 F (36.9 C) (Oral)   Resp (!) 23   SpO2 100%   Physical Exam Vitals signs and nursing note reviewed.  Constitutional:      Appearance: He is well-developed.  HENT:     Head: Normocephalic and atraumatic.  Cardiovascular:     Rate and Rhythm: Normal rate and regular rhythm.     Heart sounds: No murmur.  Pulmonary:     Effort: Pulmonary effort is normal. No respiratory distress.     Breath sounds: Normal breath sounds.  Abdominal:      Palpations: Abdomen is soft.     Tenderness: There is no abdominal tenderness. There is no guarding or rebound.  Musculoskeletal:        General: Swelling and tenderness present.     Comments: 3+ pitting edema to bilateral lower extremities. 2+ DP pulses.  Skin:    General: Skin is warm and dry.     Coloration: Skin is pale.  Neurological:     Mental Status: He is alert and oriented to person, place, and time.     Comments: Nonverbal. Communicates by writing.  Psychiatric:        Behavior: Behavior normal.      ED Treatments / Results  Labs (all labs ordered are listed, but only abnormal results are displayed) Labs Reviewed  CBC WITH DIFFERENTIAL/PLATELET - Abnormal; Notable for the following components:      Result Value   RBC 2.23 (*)    Hemoglobin 6.6 (*)    HCT 20.6 (*)    Lymphs Abs 0.4 (*)    Abs Immature Granulocytes 0.29 (*)    All other components within normal limits  COMPREHENSIVE METABOLIC PANEL  MAGNESIUM    EKG None  Radiology Dg Chest 2 View  Result Date: 07/06/2018 CLINICAL DATA:  Bilateral lower extremity swelling, chronic cough EXAM: CHEST - 2 VIEW COMPARISON:  07/01/2018 FINDINGS: The heart size and mediastinal contours are within normal limits. 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 The visualized skeletal structures are unremarkable. IMPRESSION: No acute abnormality of the lungs. 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 Electronically Signed   By: Eddie Candle M.D.   On: 07/06/2018 23:00    Procedures Procedures (including critical care time)  Medications Ordered in ED Medications - No data to display   Initial Impression / Assessment and Plan / ED Course  I have reviewed the triage vital signs and the nursing notes.  Pertinent labs & imaging results that were available during my care of the patient were reviewed by me and considered in my medical decision making (see chart for details).        Patient here for evaluation of significant  lower extremity edema, recently hospitalized for sepsis and electrolyte derangement. He does have edema on examination without any respiratory distress. Plan to check labs to evaluate for recurrent anemia, electrolytes arrangements. Patient care transferred pending labs and recheck.  Final Clinical Impressions(s) / ED Diagnoses   Final diagnoses:  None    ED Discharge Orders    None       Quintella Reichert, MD 07/06/18 2311

## 2018-07-06 NOTE — ED Notes (Signed)
Pt recent admission at [facility]. Pt reports sleeping today and waking up with swelling to bilat feet. Pt denies SOB, chest pain.

## 2018-07-07 MED ORDER — OXYCODONE-ACETAMINOPHEN 5-325 MG/5ML PO SOLN: 5.0000 mL | ORAL | 0 refills | Status: AC | PRN

## 2018-07-07 MED ORDER — HEPARIN SOD (PORK) LOCK FLUSH 100 UNIT/ML IV SOLN: 500.0000 [IU] | Freq: Once | INTRAVENOUS | Status: DC

## 2018-07-07 MED ORDER — HEPARIN SOD (PORK) LOCK FLUSH 100 UNIT/ML IV SOLN
INTRAVENOUS | Status: AC
  Administered 2018-07-07: 500 [IU]
  Filled 2018-07-07: qty 5

## 2018-07-07 MED ORDER — FUROSEMIDE 40 MG PO TABS: 40.0000 mg | ORAL_TABLET | Freq: Every day | ORAL | 0 refills | Status: AC

## 2018-07-07 MED ORDER — POTASSIUM CHLORIDE 20 MEQ/15ML (10%) PO SOLN
40.0000 meq | Freq: Once | ORAL | Status: AC
  Administered 2018-07-07: 40 meq via ORAL
  Filled 2018-07-07: qty 30

## 2018-07-07 NOTE — Discharge Instructions (Addendum)
Begin taking Lasix as prescribed this evening.  Increase your potassium from 40 mg twice daily to 40 mg 3 times daily while taking Lasix.  Follow-up with your doctors on Monday as scheduled, and return to the ER in the meantime if you develop difficulty breathing, high fevers, chest pain, or other new and concerning symptoms.

## 2018-07-07 NOTE — ED Notes (Signed)
Medications explained to patient prior to administration. Pt adamant he be discharged directly after receiving medications. RN explained that Lasix would make the patient need to void very quickly. Pt maintains he would like to go home directly after administration. EDP notified.

## 2018-07-07 NOTE — ED Notes (Signed)
Port de-accessed

## 2018-07-08 ENCOUNTER — Telehealth (HOSPITAL_BASED_OUTPATIENT_CLINIC_OR_DEPARTMENT_OTHER): Payer: Self-pay | Admitting: Emergency Medicine

## 2018-07-08 MED ORDER — OXYCODONE HCL 5 MG/5ML PO SOLN: 5.0000 mg | ORAL | 0 refills | Status: AC | PRN

## 2018-07-08 MED FILL — OXYCODONE HCL 5 MG/5ML SOLN: 5 | 4 days supply | Qty: 100 | Fill #0

## 2018-07-08 NOTE — Telephone Encounter (Signed)
Patient's wife came since apparently the oxycodone acetaminophen liquid is not made for his PEG tube.  Needs a separate prescription for oxycodone liquid and Tylenol liquid.  Will attempt to add one now.

## 2018-07-16 ENCOUNTER — Encounter: Admit: 2018-07-16 | Discharge: 2018-07-16

## 2018-07-16 ENCOUNTER — Ambulatory Visit: Admit: 2018-07-16 | Discharge: 2018-07-17 | Payer: Private Health Insurance - Indemnity

## 2018-07-16 DIAGNOSIS — M25512 Pain in left shoulder: Principal | ICD-10-CM

## 2018-07-16 NOTE — Progress Notes
BP Source: Arm, Right Upper   Patient Position: Sitting   Pulse: 67   Weight: 89.8 kg (198 lb)   Height: 180.3 cm (71)     Body mass index is 27.62 kg/m???.     Physical Exam  Ortho Exam  The left shoulder was evaluated.    Skin intact.  The SC joint is is not tender to palpation.  The [facility] Joint is is not tender palpation. No TTP over bicipital groove.     Range of Motion Exam  Full AROM, mild pain at extremes of ROM    Rotator Cuff Exam  RTC strength:  Supraspinatus 4/5   Infraspinatus 3/5   Subscapularis 5/5  Pain with rotator cuff testing positive.  positive Neer/Hawkins impingement sign.     AC Exam  negative cross shoulder adduction test.  AC joint prominence is not present.     Long head of biceps/Superior Labrum   negative Speeds test  negative O'Brien's test  negative Yergason's test     Stability  Anterior load and shift negative    Posterior load and shift negative   Sulcus sign absent  Apprehension Test negative      Sensation is intact to light-touch throughout the entire extremity. There is a palpable radial pulse.    Plain films of the left shoulder were obtained and demonstrate:  1. ???No acute or subacute changes. There are no fractures. Normal   glenohumeral alignment and joint space.    2. ???Mild-to-moderate degenerative changes superior aspect of the greater   tuberosity. This mild scalloping of the undersurface of the acromion. This   could reflect rotator cuff pathology.    3. ???There are 2 small smoothly marginated corticated ossicles along the   superior aspect of the distal clavicle proximal to the a.c. articulation.   There is also a sessile bony bump along the superior aspect of the distal   clavicle just proximal to these ossicles. This may reflect sequela to   previous remote trauma with small foci of chronic heterotopic   ossification. This could also reflect small residual fracture fragment   from previous old injury.    4. ???Mild acromio clavicular osteoarthritis.     Assessment and Plan:

## 2018-07-17 ENCOUNTER — Ambulatory Visit: Admit: 2018-07-16 | Discharge: 2018-07-16

## 2018-07-17 DIAGNOSIS — M25512 Pain in left shoulder: Principal | ICD-10-CM

## 2018-07-17 DIAGNOSIS — M24812 Other specific joint derangements of left shoulder, not elsewhere classified: ICD-10-CM

## 2018-07-19 ENCOUNTER — Encounter (HOSPITAL_BASED_OUTPATIENT_CLINIC_OR_DEPARTMENT_OTHER): Payer: Self-pay | Admitting: *Deleted

## 2018-07-19 ENCOUNTER — Emergency Department (HOSPITAL_BASED_OUTPATIENT_CLINIC_OR_DEPARTMENT_OTHER)
Admission: EM | Admit: 2018-07-19 | Discharge: 2018-07-19 | Disposition: A | Discharge status: Home / Self Care | Payer: Medicaid Other | Attending: Emergency Medicine | Admitting: Emergency Medicine

## 2018-07-19 ENCOUNTER — Other Ambulatory Visit: Payer: Self-pay

## 2018-07-19 DIAGNOSIS — Z87891 Personal history of nicotine dependence: Secondary | ICD-10-CM | POA: Insufficient documentation

## 2018-07-19 DIAGNOSIS — Z79899 Other long term (current) drug therapy: Secondary | ICD-10-CM | POA: Diagnosis not present

## 2018-07-19 DIAGNOSIS — D649 Anemia, unspecified: Secondary | ICD-10-CM

## 2018-07-19 LAB — CBC WITH DIFFERENTIAL/PLATELET
Abs Immature Granulocytes: 0.05 10*3/uL (ref 0.00–0.07)
Basophils Absolute: 0 10*3/uL (ref 0.0–0.1)
Basophils Relative: 0 %
Eosinophils Absolute: 0 10*3/uL (ref 0.0–0.5)
Eosinophils Relative: 0 %
HCT: 21.7 % — ABNORMAL LOW (ref 39.0–52.0)
Hemoglobin: 7 g/dL — ABNORMAL LOW (ref 13.0–17.0)
Immature Granulocytes: 1 %
Lymphocytes Relative: 4 %
Lymphs Abs: 0.4 10*3/uL — ABNORMAL LOW (ref 0.7–4.0)
MCH: 29.7 pg (ref 26.0–34.0)
MCHC: 32.3 g/dL (ref 30.0–36.0)
MCV: 91.9 fL (ref 80.0–100.0)
Monocytes Absolute: 1.1 10*3/uL — ABNORMAL HIGH (ref 0.1–1.0)
Monocytes Relative: 10 %
Neutro Abs: 9.5 10*3/uL — ABNORMAL HIGH (ref 1.7–7.7)
Neutrophils Relative %: 85 %
Platelets: 404 10*3/uL — ABNORMAL HIGH (ref 150–400)
RBC: 2.36 MIL/uL — ABNORMAL LOW (ref 4.22–5.81)
RDW: 17.1 % — ABNORMAL HIGH (ref 11.5–15.5)
WBC: 11 10*3/uL — ABNORMAL HIGH (ref 4.0–10.5)
nRBC: 0 % (ref 0.0–0.2)

## 2018-07-19 LAB — HEPATIC FUNCTION PANEL
ALT: 32 U/L (ref 0–44)
AST: 77 U/L — ABNORMAL HIGH (ref 15–41)
Albumin: 2.1 g/dL — ABNORMAL LOW (ref 3.5–5.0)
Alkaline Phosphatase: 93 U/L (ref 38–126)
Bilirubin, Direct: 0.1 mg/dL (ref 0.0–0.2)
Indirect Bilirubin: 0.3 mg/dL (ref 0.3–0.9)
Total Bilirubin: 0.4 mg/dL (ref 0.3–1.2)
Total Protein: 5.6 g/dL — ABNORMAL LOW (ref 6.5–8.1)

## 2018-07-19 LAB — BASIC METABOLIC PANEL
Anion gap: 8 (ref 5–15)
BUN: 19 mg/dL (ref 6–20)
CO2: 28 mmol/L (ref 22–32)
Calcium: 8.3 mg/dL — ABNORMAL LOW (ref 8.9–10.3)
Chloride: 93 mmol/L — ABNORMAL LOW (ref 98–111)
Creatinine, Ser: 0.78 mg/dL (ref 0.61–1.24)
GFR calc Af Amer: >60 mL/min (ref >60)
GFR calc non Af Amer: >60 mL/min (ref >60)
Glucose, Bld: 104 mg/dL — ABNORMAL HIGH (ref 70–99)
Potassium: 4.2 mmol/L (ref 3.5–5.1)
Sodium: 129 mmol/L — ABNORMAL LOW (ref 135–145)

## 2018-07-19 LAB — OCCULT BLOOD X 1 CARD TO LAB, STOOL: Fecal Occult Bld: NEGATIVE

## 2018-07-19 MED ORDER — HEPARIN SOD (PORK) LOCK FLUSH 100 UNIT/ML IV SOLN
500.0000 [IU] | Freq: Once | INTRAVENOUS | Status: AC
  Administered 2018-07-19: 500 [IU]

## 2018-07-19 MED ORDER — HEPARIN SOD (PORK) LOCK FLUSH 100 UNIT/ML IV SOLN
INTRAVENOUS | Status: AC
  Administered 2018-07-19: 17:00:00 500 [IU]
  Filled 2018-07-19: qty 5

## 2018-07-19 NOTE — ED Triage Notes (Signed)
His feeding tube is clogged per pts sister at the bedside. His last feeding was 6pm yesterday.

## 2018-07-19 NOTE — ED Provider Notes (Signed)
[facility] EMERGENCY DEPARTMENT Provider Note   CSN: [id number] Arrival date & time: 07/19/18  1415    History   Chief Complaint Chief Complaint  Patient presents with   Illness    HPI Ian Richard is a 58 y.o. male.     The history is provided by the patient.  Illness  Location:  Feeding tube Severity:  Mild Onset quality:  Gradual Duration:  5 days Timing:  Constant Progression:  Unchanged Chronicity:  New Context:  Patient states that he has had some black material from his feeding tube.  Denies any vomiting of blood, states that maybe his stools have been dark.  Had questions about how to flush his feeding tube.  Feeding tube is not clogged. Relieved by:  Nothing Worsened by:  Nothing Associated symptoms: no abdominal pain, no chest pain, no congestion, no cough, no ear pain, no fever, no nausea, no rash, no shortness of breath, no sore throat and no vomiting     Past Medical History:  Diagnosis Date   Left inguinal hernia    Throat cancer ([place])     There are no active problems to display for this patient.   Past Surgical History:  Procedure Laterality Date   APPENDECTOMY     GASTROSTOMY W/ FEEDING TUBE     THROAT BX     TOOTH EXTRACTION Bilateral 05/03/2018   Procedure: MULTIPLE EXTRACTIONS;  Surgeon: Diona Browner, DDS;  Location: [facility];  Service: Oral Surgery;  Laterality: Bilateral;  MULTIPLE EXTRACTIONS        Home Medications    Prior to Admission medications   Medication Sig Start Date End Date Taking? Authorizing Provider  acetaminophen (TYLENOL) 500 MG tablet Place 500-1,000 mg into feeding tube every 6 (six) hours as needed.    [provider]  amoxicillin (AMOXIL) 400 MG/5ML suspension Take 5 mLs (400 mg total) by mouth 2 (two) times daily. 05/03/18   Diona Browner, DDS  amoxicillin (AMOXIL) 500 MG capsule Take 1 capsule (500 mg total) by mouth 3 (three) times daily. 05/03/18   Diona Browner, DDS  furosemide  (LASIX) 40 MG tablet Take 1 tablet (40 mg total) by mouth daily. 07/07/18   Veryl Speak, MD  magnesium hydroxide (MILK OF MAGNESIA) 400 MG/5ML suspension Take by mouth daily as needed for mild constipation.    [provider]  Multiple Vitamin (MULTIVITAMIN WITH MINERALS) TABS tablet Place 1 tablet into feeding tube daily.    [provider]  ondansetron (ZOFRAN) 8 MG tablet Take by mouth every 8 (eight) hours as needed for nausea or vomiting.    [provider]  oxyCODONE (ROXICODONE) 5 MG/5ML solution Take 5 mLs (5 mg total) by mouth every 4 (four) hours as needed for severe pain. 07/08/18   Davonna Belling, MD  oxyCODONE-acetaminophen (ROXICET) 5-325 MG/5ML solution Place 5 mLs into feeding tube every 4 (four) hours as needed for severe pain. 07/07/18   Veryl Speak, MD  potassium chloride SA (K-DUR) 20 MEQ tablet Take 20 mEq by mouth 2 (two) times daily.    [provider]    Family History No family history on file.  Social History Social History   Tobacco Use   Smoking status: Former Smoker    Last attempt to quit: 04/03/2018    Years since quitting: 0.2   Smokeless tobacco: Former Systems developer  Substance Use Topics   Alcohol use: Never    Frequency: Never   Drug use: Never  Allergies   Bee venom and Hornet venom   Review of Systems Review of Systems  Constitutional: Negative for chills and fever.  HENT: Negative for congestion, ear pain and sore throat.   Eyes: Negative for pain and visual disturbance.  Respiratory: Negative for cough and shortness of breath.   Cardiovascular: Negative for chest pain and palpitations.  Gastrointestinal: Negative for abdominal pain, blood in stool, constipation, nausea and vomiting.  Genitourinary: Negative for dysuria and hematuria.  Musculoskeletal: Negative for arthralgias and back pain.  Skin: Negative for color change and rash.  Neurological: Negative for seizures and syncope.  All other  systems reviewed and are negative.    Physical Exam Updated Vital Signs  ED Triage Vitals  Enc Vitals Group     BP 07/19/18 1427 (!) 130/96     Pulse Rate 07/19/18 1427 (!) 52     Resp 07/19/18 1427 18     Temp 07/19/18 1427 98.3 F (36.8 C)     Temp Source 07/19/18 1427 Oral     SpO2 07/19/18 1427 100 %     Weight 07/19/18 1425 100 lb 1.4 oz (45.4 kg)     Height 07/19/18 1425 5\' 8"  (1.727 m)     Head Circumference --      Peak Flow --      Pain Score 07/19/18 1425 0     Pain Loc --      Pain Edu? --      Excl. in [place]? --     Physical Exam Vitals signs and nursing note reviewed.  Constitutional:      General: He is not in acute distress.    Appearance: He is well-developed. He is not ill-appearing.  HENT:     Head: Normocephalic and atraumatic.  Eyes:     Extraocular Movements: Extraocular movements intact.     Conjunctiva/sclera: Conjunctivae normal.     Pupils: Pupils are equal, round, and reactive to light.  Neck:     Musculoskeletal: Neck supple.  Cardiovascular:     Rate and Rhythm: Normal rate and regular rhythm.     Pulses: Normal pulses.     Heart sounds: Normal heart sounds. No murmur.  Pulmonary:     Effort: Pulmonary effort is normal. No respiratory distress.     Breath sounds: Normal breath sounds.  Abdominal:     Palpations: Abdomen is soft.     Tenderness: There is no abdominal tenderness.     Comments: Feeding tube with dark material in it  Genitourinary:    Rectum: Guaiac result negative (stool is grossly brown).  Skin:    General: Skin is warm and dry.  Neurological:     Mental Status: He is alert.      ED Treatments / Results  Labs (all labs ordered are listed, but only abnormal results are displayed) Labs Reviewed  CBC WITH DIFFERENTIAL/PLATELET - Abnormal; Notable for the following components:      Result Value   WBC 11.0 (*)    RBC 2.36 (*)    Hemoglobin 7.0 (*)    HCT 21.7 (*)    RDW 17.1 (*)    Platelets 404 (*)    Neutro  Abs 9.5 (*)    Lymphs Abs 0.4 (*)    Monocytes Absolute 1.1 (*)    All other components within normal limits  BASIC METABOLIC PANEL - Abnormal; Notable for the following components:   Sodium 129 (*)    Chloride 93 (*)    Glucose,  Bld 104 (*)    Calcium 8.3 (*)    All other components within normal limits  HEPATIC FUNCTION PANEL - Abnormal; Notable for the following components:   Total Protein 5.6 (*)    Albumin 2.1 (*)    AST 77 (*)    All other components within normal limits  OCCULT BLOOD X 1 CARD TO LAB, STOOL  POC OCCULT BLOOD, ED    EKG None  Radiology No results found.  Procedures Procedures (including critical care time)  Medications Ordered in ED Medications  heparin lock flush 100 unit/mL (500 Units Intracatheter Given 07/19/18 1711)     Initial Impression / Assessment and Plan / ED Course  I have reviewed the triage vital signs and the nursing notes.  Pertinent labs & imaging results that were available during my care of the patient were reviewed by me and considered in my medical decision making (see chart for details).     Ian Richard is a 58 year old male with history of throat cancer currently on radiation who presents the ED with feeding tube issue.  Patient with normal vitals.  No fever.  Patient has questions about flushing his feeding tube.  He states that he has had slightly darker material in his feeding tube.  Triage note states that he has been spitting up blood and had black stools but he denies spitting up blood.  He has not noticed that he has had black stools.  He has brown stool on exam with negative Hemoccult.  His feeding tube does have some darker material in it.  However, patient is on antibiotics for some superficial cellulitis around his feeding tube.  He does not appear to have a worsening infection around his feeding tube.  Patient denies any hemoptysis.  Feeding tube looks to probably just have some stomach acid in it.  His last hemoglobin  in our facility was 6.6.  He did not want to be admitted at that time.  He has severe peripheral edema that has been attributed to his low hemoglobins in the past.  According to chart patient had a hemoglobin of 8.6 several days ago.  Overall he denies any chest pain, shortness of breath.  Will recheck labs today to see where his hemoglobin is.  Have a low concern for GI bleed.  Suspect intermittent anemia has been from volume overload versus poor nutrition.  Patient with no significant leukocytosis.  Hemoglobin is 7.  Patient continues to have low albumin.  Otherwise has mild hyponatremia at 129.  Patient was offered an admission to trend his hemoglobin but patient prefers to follow-up outpatient.  He has normal vitals.  No hypotension.  No tachycardia.  Negative Hemoccult and brown stools on exam.  Have a low concern for GI bleed.  I think it would have been reasonable to admit him to trend his hemoglobin but patient has capacity to leave.  Patient was discharged from the ED in good condition.  He understands return precautions.  States that he will return if he feels worse, develops dark, black stools, coughs up blood.  Feeding tube is flushing well at time of discharge.  This chart was dictated using voice recognition software.  Despite best efforts to proofread,  errors can occur which can change the documentation meaning.    Final Clinical Impressions(s) / ED Diagnoses   Final diagnoses:  Anemia, unspecified type    ED Discharge Orders    None       Philamena Kramar, DO  07/19/18 1714 ° °

## 2018-07-19 NOTE — Discharge Instructions (Addendum)
Your hemoglobin today was 7.  Please return to the ED if you have dark, tarry stools.  If you start having vomitus of blood.  Return if you have any other concerning symptoms.  Follow-up with your doctor as we discussed.

## 2018-07-19 NOTE — ED Notes (Signed)
Pt. Has noted black congealed liquid in his feeding tube that he reports by writing down on paper is making him sick.  Pt. Reports this started making him sick and made him vomit last night.  Pt. Reports his feeding tube is not clogged.

## 2018-07-19 NOTE — ED Notes (Signed)
Family at bedside. 

## 2018-07-19 NOTE — ED Notes (Signed)
Pt. Reports to EDP after his assessment that he has had bloody stool and spitting up blood.

## 2018-07-24 ENCOUNTER — Encounter: Admit: 2018-07-24 | Discharge: 2018-07-24

## 2018-07-24 NOTE — Progress Notes
Pt's wife called office requesting the MRI orders be sent to [facility] instead of [facility] due to cost.  Orders faxed.

## 2018-07-25 ENCOUNTER — Encounter: Admit: 2018-07-25 | Discharge: 2018-07-26

## 2018-07-31 ENCOUNTER — Encounter: Admit: 2018-07-31 | Discharge: 2018-07-31

## 2018-07-31 NOTE — Telephone Encounter
Left message with KAIN MILOSEVIC to call back and let us know where he received his MRI at so we may get clouded and results.  Left direct number to call back.

## 2018-08-02 ENCOUNTER — Encounter: Admit: 2018-08-02 | Discharge: 2018-08-02

## 2018-08-02 ENCOUNTER — Ambulatory Visit: Admit: 2018-08-02 | Discharge: 2018-08-03

## 2018-08-02 DIAGNOSIS — M19012 Primary osteoarthritis, left shoulder: Secondary | ICD-10-CM

## 2018-08-02 DIAGNOSIS — S46012A Strain of muscle(s) and tendon(s) of the rotator cuff of left shoulder, initial encounter: Secondary | ICD-10-CM

## 2018-08-02 NOTE — Progress Notes
Date of Service: 08/02/2018     Subjective:           Gary Clarke is a 58 y.o. male presents to the clinic today to follow-up on his left shoulder and to review recent MRI results.  He reports no change in his symptoms.  He still has pain in the left shoulder and significant weakness.        History of Present Illness  Gary Clarke is a 58 y.o. male.   Chief Complaint   Patient presents with   ??? Left Shoulder - Follow Up          Review of Systems    History reviewed. No pertinent past medical history.  History reviewed. No pertinent surgical history.  ALLERGIES:  Patient has no known allergies.      Objective:         No current outpatient medications on file.     Vitals:    08/02/18 1337   BP: 137/72   BP Source: Arm, Left Upper   Patient Position: Sitting   Pulse: 66   Weight: 88.5 kg (195 lb)   Height: 180.3 cm (71)     Body mass index is 27.2 kg/m???.     Physical Exam  Ortho Exam  Physical exam is unchanged from prior assessment    MRI of the left shoulder indicates:  1.  Acute or subacute massive full-thickness, near full width tearing of the supraspinatus and infraspinatus tendons at the footprint attachments with medial retraction of the torn fibers to the glenohumeral joint line.  2.  Distal clavicle is chronically displaced superiorly with respect to the undersurface of the acromion and is likely sequela of remote tearing of the superior and inferior acromioclavicular ligaments.  The coracoclavicular distance and the coracoclavicular ligament are intact.  3.  Subacromial spur predisposes the patient to subacromial impingement.     Assessment and Plan:  Assessment: 58 y.o. male with:    ICD-9-CM ICD-10-CM    1. Traumatic complete tear of left rotator cuff, initial encounter 840.4 S46.012A    2. 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  We also discussed treatment options including operative and non-operative management.  At this time given the severity of the patient's injury I would recommend operative intervention in the form of a left shoulder arthroscopy with subacromial decompression, acromioplasty, distal clavicle excision and mini open rotator cuff repair with all other indicated procedures.  The risks and benefits of surgical intervention were discussed with the patient and the procedure will be scheduled at his convenience.  Risks associated with surgery, include but are not limited to, injury of nerve or vessel, infection, DVT, hardware failure, graft failure, fracture, anesthetic complications, persistent pain or stiffness, and possible need for further surgery.  He verbalized understanding of the above mentioned procedure.  He was informed that Erlene Quan, MD would be performing the procedure. He was given appropriate surgical packet and scheduling information.    Gary Pinks, PA-C

## 2018-08-03 ENCOUNTER — Other Ambulatory Visit: Payer: Self-pay

## 2018-08-03 ENCOUNTER — Emergency Department (HOSPITAL_BASED_OUTPATIENT_CLINIC_OR_DEPARTMENT_OTHER)
Admission: EM | Admit: 2018-08-03 | Discharge: 2018-08-03 | Disposition: A | Discharge status: Home / Self Care | Payer: Medicaid Other | Attending: Emergency Medicine | Admitting: Emergency Medicine

## 2018-08-03 ENCOUNTER — Encounter (HOSPITAL_BASED_OUTPATIENT_CLINIC_OR_DEPARTMENT_OTHER): Payer: Self-pay | Admitting: Emergency Medicine

## 2018-08-03 DIAGNOSIS — R2243 Localized swelling, mass and lump, lower limb, bilateral: Secondary | ICD-10-CM | POA: Diagnosis not present

## 2018-08-03 DIAGNOSIS — Z79899 Other long term (current) drug therapy: Secondary | ICD-10-CM | POA: Diagnosis not present

## 2018-08-03 DIAGNOSIS — E871 Hypo-osmolality and hyponatremia: Secondary | ICD-10-CM | POA: Insufficient documentation

## 2018-08-03 DIAGNOSIS — R531 Weakness: Secondary | ICD-10-CM | POA: Diagnosis present

## 2018-08-03 DIAGNOSIS — Z87891 Personal history of nicotine dependence: Secondary | ICD-10-CM | POA: Insufficient documentation

## 2018-08-03 DIAGNOSIS — Z85818 Personal history of malignant neoplasm of other sites of lip, oral cavity, and pharynx: Secondary | ICD-10-CM | POA: Diagnosis not present

## 2018-08-03 LAB — COMPREHENSIVE METABOLIC PANEL
ALT: 28 U/L (ref 0–44)
AST: 67 U/L — ABNORMAL HIGH (ref 15–41)
Albumin: 2.1 g/dL — ABNORMAL LOW (ref 3.5–5.0)
Alkaline Phosphatase: 136 U/L — ABNORMAL HIGH (ref 38–126)
Anion gap: 11 (ref 5–15)
BUN: 5 mg/dL — ABNORMAL LOW (ref 6–20)
CO2: 22 mmol/L (ref 22–32)
Calcium: 7.8 mg/dL — ABNORMAL LOW (ref 8.9–10.3)
Chloride: 93 mmol/L — ABNORMAL LOW (ref 98–111)
Creatinine, Ser: 0.44 mg/dL — ABNORMAL LOW (ref 0.61–1.24)
GFR calc Af Amer: >60 mL/min (ref >60)
GFR calc non Af Amer: >60 mL/min (ref >60)
Glucose, Bld: 81 mg/dL (ref 70–99)
Potassium: 3.5 mmol/L (ref 3.5–5.1)
Sodium: 126 mmol/L — ABNORMAL LOW (ref 135–145)
Total Bilirubin: 0.3 mg/dL (ref 0.3–1.2)
Total Protein: 5.7 g/dL — ABNORMAL LOW (ref 6.5–8.1)

## 2018-08-03 LAB — CBC WITH DIFFERENTIAL/PLATELET
Abs Immature Granulocytes: 0.04 10*3/uL (ref 0.00–0.07)
Basophils Absolute: 0 10*3/uL (ref 0.0–0.1)
Basophils Relative: 0 %
Eosinophils Absolute: 0 10*3/uL (ref 0.0–0.5)
Eosinophils Relative: 0 %
HCT: 28.5 % — ABNORMAL LOW (ref 39.0–52.0)
Hemoglobin: 9.6 g/dL — ABNORMAL LOW (ref 13.0–17.0)
Immature Granulocytes: 0 %
Lymphocytes Relative: 4 %
Lymphs Abs: 0.4 10*3/uL — ABNORMAL LOW (ref 0.7–4.0)
MCH: 29.9 pg (ref 26.0–34.0)
MCHC: 33.7 g/dL (ref 30.0–36.0)
MCV: 88.8 fL (ref 80.0–100.0)
Monocytes Absolute: 0.5 10*3/uL (ref 0.1–1.0)
Monocytes Relative: 5 %
Neutro Abs: 9 10*3/uL — ABNORMAL HIGH (ref 1.7–7.7)
Neutrophils Relative %: 91 %
Platelets: 307 10*3/uL (ref 150–400)
RBC: 3.21 MIL/uL — ABNORMAL LOW (ref 4.22–5.81)
RDW: 16.1 % — ABNORMAL HIGH (ref 11.5–15.5)
WBC: 9.9 10*3/uL (ref 4.0–10.5)
nRBC: 0 % (ref 0.0–0.2)

## 2018-08-03 LAB — URINALYSIS, ROUTINE W REFLEX MICROSCOPIC
Bilirubin Urine: NEGATIVE
Glucose, UA: NEGATIVE mg/dL
Hgb urine dipstick: NEGATIVE
Ketones, ur: NEGATIVE mg/dL
Leukocytes,Ua: NEGATIVE
Nitrite: NEGATIVE
Protein, ur: NEGATIVE mg/dL
Specific Gravity, Urine: <1.005 — ABNORMAL LOW (ref 1.005–1.030)
pH: 6.5 (ref 5.0–8.0)

## 2018-08-03 MED ORDER — SODIUM CHLORIDE 0.9 % IV BOLUS: 1000.0000 mL | Freq: Once | INTRAVENOUS | Status: DC

## 2018-08-03 MED ORDER — SODIUM CHLORIDE 0.9 % IV BOLUS
500.0000 mL | Freq: Once | INTRAVENOUS | Status: AC
  Administered 2018-08-03: 500 mL via INTRAVENOUS

## 2018-08-03 NOTE — ED Notes (Signed)
Pt port accessed. Sterile technique used. Blood return noted. Pt tolerated well.  

## 2018-08-03 NOTE — ED Triage Notes (Addendum)
Pt fell out of his sister's car at [employer].  No obvious injury.  Pt denies injury. Pt lives alone.  Being treated for throat cancer. Pt has multiple abrasions and bruises to his left arm, shoulder, and bil upper back, but he sts none of these are new today.

## 2018-08-03 NOTE — ED Notes (Signed)
Pt abrasions cleaned and band aids applied. Pt port deaccessed. Pt tolerated well. Pt resting on stretcher waiting for sister to pick him up. D/c instructions reviewed and no concerns or questions at this time.

## 2018-08-03 NOTE — ED Notes (Signed)
ED Provider at bedside. 

## 2018-08-03 NOTE — Discharge Instructions (Signed)
As we discussed, your sodium was slightly low here today in the emergency department.  It has improved from 07/30/18 when it was checked by her oncologist.   He is follow-up with your oncologist as directed.  Sure you are continuing to do feeds through the G-tube.  Return the emergency department for any chest pain, difficulty breathing, difficulty walking or any other worsening or concerning symptoms.

## 2018-08-03 NOTE — ED Notes (Signed)
Pt ambulated to wheelchair in hallway outside door. Pt had steady gate. Assisted to BR,.

## 2018-08-03 NOTE — ED Provider Notes (Signed)
[facility] EMERGENCY DEPARTMENT Provider Note   CSN: [id number] Arrival date & time: 08/03/18  1200    History   Chief Complaint Chief Complaint  Patient presents with  . Fall    HPI Ian Richard is a 58 y.o. male with PMH/o throat cancer who presents for evaluation after a mechanical fall that occurred just prior to ED arrival. Patient reports that he was in the grocery store parking lot with his siter and states that he lost his balance and fell. No preceding CP or dizziness. Patient states he did not hit his head or have any LOC.He is not on blood thinners. He denies any associated pain from the fall. He states he does feel generalized weakness, which he felt may have contributed to his fall. He denies any focal weakness. He reports edema of his BLE which as been an ongoing issue over the last several months. Patient denies any CP, SOB, abdominal pain, nausea/vomiting, numbness/weakness of his extremities, new neck or back pain.      The history is provided by the patient.    Past Medical History:  Diagnosis Date  . Left inguinal hernia   . Throat cancer ([place])     There are no active problems to display for this patient.   Past Surgical History:  Procedure Laterality Date  . APPENDECTOMY    . GASTROSTOMY W/ FEEDING TUBE    . THROAT BX    . TOOTH EXTRACTION Bilateral 05/03/2018   Procedure: MULTIPLE EXTRACTIONS;  Surgeon: Diona Browner, DDS;  Location: [facility];  Service: Oral Surgery;  Laterality: Bilateral;  MULTIPLE EXTRACTIONS        Home Medications    Prior to Admission medications   Medication Sig Start Date End Date Taking? Authorizing Provider  acetaminophen (TYLENOL) 500 MG tablet Place 500-1,000 mg into feeding tube every 6 (six) hours as needed.    [provider]  amoxicillin (AMOXIL) 400 MG/5ML suspension Take 5 mLs (400 mg total) by mouth 2 (two) times daily. 05/03/18   Diona Browner, DDS  amoxicillin (AMOXIL) 500 MG capsule Take 1  capsule (500 mg total) by mouth 3 (three) times daily. 05/03/18   Diona Browner, DDS  furosemide (LASIX) 40 MG tablet Take 1 tablet (40 mg total) by mouth daily. 07/07/18   Veryl Speak, MD  magnesium hydroxide (MILK OF MAGNESIA) 400 MG/5ML suspension Take by mouth daily as needed for mild constipation.    [provider]  Multiple Vitamin (MULTIVITAMIN WITH MINERALS) TABS tablet Place 1 tablet into feeding tube daily.    [provider]  ondansetron (ZOFRAN) 8 MG tablet Take by mouth every 8 (eight) hours as needed for nausea or vomiting.    [provider]  oxyCODONE (ROXICODONE) 5 MG/5ML solution Take 5 mLs (5 mg total) by mouth every 4 (four) hours as needed for severe pain. 07/08/18   Davonna Belling, MD  oxyCODONE-acetaminophen (ROXICET) 5-325 MG/5ML solution Place 5 mLs into feeding tube every 4 (four) hours as needed for severe pain. 07/07/18   Veryl Speak, MD  potassium chloride SA (K-DUR) 20 MEQ tablet Take 20 mEq by mouth 2 (two) times daily.    [provider]    Family History No family history on file.  Social History Social History   Tobacco Use  . Smoking status: Former Smoker    Quit date: 04/03/2018    Years since quitting: 0.3  . Smokeless tobacco: Former Network engineer Use Topics  . Alcohol use:  Never    Frequency: Never  . Drug use: Never     Allergies   Bee venom and Hornet venom   Review of Systems Review of Systems  Eyes: Negative for visual disturbance.  Respiratory: Negative for cough and shortness of breath.   Cardiovascular: Positive for leg swelling. Negative for chest pain.  Gastrointestinal: Negative for abdominal pain, nausea and vomiting.  Genitourinary: Negative for dysuria and hematuria.  Musculoskeletal: Negative for back pain and neck pain.  Neurological: Positive for weakness (Generalized). Negative for headaches.  All other systems reviewed and are negative.    Physical Exam Updated Vital Signs  BP 107/69 (BP Location: Right Arm)   Pulse 68   Temp 97.7 F (36.5 C) (Oral)   Resp 16   Ht 5' 7"  (1.702 m)   Wt 50.8 kg   SpO2 98%   BMI 17.54 kg/m   Physical Exam Vitals signs and nursing note reviewed.  Constitutional:      Appearance: He is cachectic.     Comments: Frail and chronically ill appearing.  HENT:     Head: Normocephalic and atraumatic.     Comments: No tenderness to palpation of skull. No deformities or crepitus noted. No open wounds, abrasions or lacerations.  Eyes:     General: Lids are normal.     Conjunctiva/sclera: Conjunctivae normal.     Pupils: Pupils are equal, round, and reactive to light.     Comments: PERRL. EOMs intact.   Neck:     Musculoskeletal: Full passive range of motion without pain.     Comments: Full flexion/extension and lateral movement of neck fully intact. No bony midline tenderness. No deformities or crepitus.  Cardiovascular:     Rate and Rhythm: Normal rate and regular rhythm.     Pulses: Normal pulses.          Radial pulses are 2+ on the right side and 2+ on the left side.       Dorsalis pedis pulses are 2+ on the right side and 2+ on the left side.     Heart sounds: Normal heart sounds. No murmur. No friction rub. No gallop.   Pulmonary:     Effort: Pulmonary effort is normal.     Breath sounds: Normal breath sounds.     Comments: Lungs clear to auscultation bilaterally.  Symmetric chest rise.  No wheezing, rales, rhonchi. Abdominal:     Palpations: Abdomen is soft. Abdomen is not rigid.     Tenderness: There is no abdominal tenderness. There is no guarding.     Comments: Lungs clear to auscultation bilaterally.  Symmetric chest rise.  No wheezing, rales, rhonchi. G tube in place. No surrounding warmth, erythema.   Musculoskeletal: Normal range of motion.     Comments: 3+ pitting edema noted to BLE from the mid tib-fib region that extends distally. No overlying warmth, erythema. No midline T or L spine tenderness.   Skin:     General: Skin is warm and dry.     Capillary Refill: Capillary refill takes less than 2 seconds.     Comments: Scattered, scabbed over abrasions noted to left shoulder, which patient states are old.  Neurological:     Mental Status: He is alert and oriented to person, place, and time.  Psychiatric:        Speech: Speech normal.      ED Treatments / Results  Labs (all labs ordered are listed, but only abnormal results are displayed) Labs Reviewed  COMPREHENSIVE METABOLIC PANEL - Abnormal; Notable for the following components:      Result Value   Sodium 126 (*)    Chloride 93 (*)    BUN 5 (*)    Creatinine, Ser 0.44 (*)    Calcium 7.8 (*)    Total Protein 5.7 (*)    Albumin 2.1 (*)    AST 67 (*)    Alkaline Phosphatase 136 (*)    All other components within normal limits  CBC WITH DIFFERENTIAL/PLATELET - Abnormal; Notable for the following components:   RBC 3.21 (*)    Hemoglobin 9.6 (*)    HCT 28.5 (*)    RDW 16.1 (*)    Neutro Abs 9.0 (*)    Lymphs Abs 0.4 (*)    All other components within normal limits  URINALYSIS, ROUTINE W REFLEX MICROSCOPIC - Abnormal; Notable for the following components:   Specific Gravity, Urine <1.005 (*)    All other components within normal limits    EKG EKG Interpretation  Date/Time:  Saturday August 03 2018 14:31:38 EDT Ventricular Rate:  79 PR Interval:    QRS Duration: 106 QT Interval:  432 QTC Calculation: 496 R Axis:   84 Text Interpretation:  Sinus rhythm Nonspecific T abnormalities, anterior leads Borderline prolonged QT interval since last tracing no significant change Confirmed by Malvin Johns [phone]) on 08/03/2018 2:34:18 PM   Radiology No results found.  Procedures Procedures (including critical care time)  Medications Ordered in ED Medications  sodium chloride 0.9 % bolus 500 mL (0 mLs Intravenous Stopped 08/03/18 1429)     Initial Impression / Assessment and Plan / ED Course  I have reviewed the triage vital  signs and the nursing notes.  Pertinent labs & imaging results that were available during my care of the patient were reviewed by me and considered in my medical decision making (see chart for details).        58 y.o. M with PMH/o throat cancer who presents today for evaluation of generalized weakness.  She reports that he was in a grocery store parking lot and states that he fell and lost his balance.  Patient states that he is not complaining of any pain associated with the fall.  He does report that he has some generalized weakness.  He states is been ongoing issue.  No chest pain, difficulty breathing, numbness/weakness of his arms or legs.  No head injury, LOC.  He is not on any blood thinners.  No indication for CT head.  Patient is afebrile, non-toxic appearing, sitting comfortably on examination table. Vital signs reviewed and stable.  He is frail and chronically ill-appearing.  He has no neuro deficits on exam.  No midline C, T or L-spine tenderness.  He has scattered abrasions noted over his left shoulder but he states that this is not a new issue.  He does have 3+ pitting edema to the bilateral lower extremities from the mid tib-fib that extends distally.  He states that is been an ongoing issue for the last several months.  Will plan for EKG, labs, gentle fluids given edema.  CMP shows sodium of 126, BUN 5, creatinine of 0.4.  Albumin is 2.1.  Alk phos 136.  CBC shows no leukocytosis.  Hemoglobin 9.6.  UA negative for any infectious etiology.  View of his records show that hyponatremia is not a new issue.  He has previously been around 129 when he has seen in the ED before.  Additionally, he was noted  to have hyponatremia on 07/30/2018 and was given some IV fluids.   Discussed results with patient.  I did discuss with him that his sodium was low though it was higher than it had previously been over the last week or so.  Patient states that he felt better and was able to ambulate in the  department without any dizziness or weakness.  He states he overall still feels fatigue but states that this is a chronic issue that he relates to his cancer.  I did offer admission for treatment of hyponatremia but he declined at this time.  He exhibits full medical decision-making capacity. At this time, patient exhibits no emergent life-threatening condition that require further evaluation in ED. Patient had ample opportunity for questions and discussion. All patient's questions were answered with full understanding. Strict return precautions discussed. Patient expresses understanding and agreement to plan.   Portions of this note were generated with Lobbyist. Dictation errors may occur despite best attempts at proofreading.   Final Clinical Impressions(s) / ED Diagnoses   Final diagnoses:  Hyponatremia  Generalized weakness    ED Discharge Orders    None       Volanda Napoleon, PA-C 08/03/18 1725    Malvin Johns, MD 08/04/18 [phone]

## 2018-08-05 ENCOUNTER — Encounter: Admit: 2018-08-05 | Discharge: 2018-08-05

## 2018-08-05 NOTE — Progress Notes
Surgery order received, ICD 10 CPT codes, case length, patient weight added.  Will contact patient in 3-5 business days for scheduling.

## 2018-08-06 ENCOUNTER — Encounter: Admit: 2018-08-06 | Discharge: 2018-08-06

## 2018-08-06 NOTE — Telephone Encounter
LVM for patient to RTC regarding surgery scheduling.  Will await return call

## 2018-08-07 ENCOUNTER — Encounter: Admit: 2018-08-07 | Discharge: 2018-08-07

## 2018-08-07 DIAGNOSIS — S46012A Strain of muscle(s) and tendon(s) of the rotator cuff of left shoulder, initial encounter: Secondary | ICD-10-CM

## 2018-08-07 DIAGNOSIS — Z1159 Encounter for screening for other viral diseases: Secondary | ICD-10-CM

## 2018-08-07 DIAGNOSIS — M19012 Primary osteoarthritis, left shoulder: Secondary | ICD-10-CM

## 2018-08-07 MED ORDER — CEFAZOLIN 1 GRAM IJ SOLR
2 g | Freq: Once | INTRAVENOUS | 0 refills | Status: CN
Start: 2018-08-07 — End: ?

## 2018-08-07 NOTE — Telephone Encounter
RTC to patient, mutual surgery date selected.   Will begin scheduling process and send patient a detailed email.

## 2018-08-07 NOTE — Progress Notes
follow up email sent to patient regarding surgery scheduling.  will await email or call back.

## 2018-08-07 NOTE — Progress Notes
Patient scheduled for Left SAD/ACRO/DCE RTCR on 7.2 at [facility] location.  Patient was advised by T.Dorene Grebe that a representative from the surgical facility would be in contact to provide further instructions pertaining to PAT/other pertinent information.  Orders have been faxed to appropriate surgical facility and orthopedic pre-certification representative.  Surgery packet was given to patient. Post operative appointment made for 7.10 8:20 [facility] with Shanon Brow This information was sent to the patient via email on 6.17.  Patient was asked to call [phone]  with any questions or concerns and verbalized understanding of all information.  No further action is required at this time.

## 2018-08-19 ENCOUNTER — Encounter: Admit: 2018-08-19 | Discharge: 2018-08-19

## 2018-08-19 ENCOUNTER — Encounter: Admit: 2018-08-19 | Discharge: 2018-08-20

## 2018-08-19 DIAGNOSIS — Z1159 Encounter for screening for other viral diseases: Secondary | ICD-10-CM

## 2018-08-19 NOTE — Pre-Anesthesia Patient Instructions
Gary Clarke,     You are scheduled for your procedure on: 08/22/2018  at:  7:30 AM   With Dr. Freada Bergeron, Gary Scott, MD    You must arrive by:  6:00 am     ** Please keep in mind that our surgery schedule is fluid, and affected by multiple factors, so it is possible that your procedure time could change.  We will make every effort to maintain your original procedure time, but there are no guarantees. Thank you for your patience and understanding!      OUR ADDRESS IS [address] [place] [address]  [address] TO [address] AND TURN LEFT.   TAKE A LEFT AT THE FIRST OR SECOND ENTRANCE AND FOLLOW SIGNAGE TO THE AMBULATORY SURGERY CENTER ([address]).    To Reschedule call: [phone]    ________________________________________________    Below are the instructions for your upcoming procedure.                                                                                                        ??? PLEASE HAVE NOTHING TO EAT AFTER MIDNIGHT PRIOR TO YOUR PROCEDURE  ??? AVOID SMOKING GUM CANDY AND MINTS AFTER MIDNIGHT PRIOR TO YOUR PROCEDURE  ??? YOU MAY HAVE WATER ONLY UNTIL 4:00 am     * BATHE, BRUSH TEETH AND GARGLE THE MORNING OF SURGERY, NO MAKEUP, PERFUMES OR LOTION  * WEAR CASUAL, COMFORTABLE, LOOSE FITTING CLOTHING THAT ARE EASY TO GET ON AND OFF   * PLEASE BRING YOUR CELL PHONE WITH YOU TO YOUR PROCEDURE  * TAKE OFF ANY JEWELRY, TAKE OUT ANY PIERCINGS, AND LEAVE ALL OTHER VALUABLES AT HOME  * IF YOU WEAR GLASSES OR CONTACTS PLEASE BRING A CASE FOR THEIR SAFEKEEPING  * YOU WILL NEED A RESPONSIBLE ADULT (OVER THE AGE OF 18) TO BRING YOU TO YOUR PROCEDURE, DRIVE YOU HOME, AND STAY WITH YOU UNTIL THE NEXT DAY. NO UBERS, TAXIS OR BUSES ARE ALLOWED --  If you do not have a responsible adult driver we will be unable to do the procedure.    * BRING CRUTCHES OR RESPIRATORY INHALERS IF APPLICABLE  * BRING INSURANCE CARD AND DRIVERS LICENSE, AND BE PREPARED TO PAY ANY COPAY/DEDUCTIBLE ON ARRIVAL WHEN CHECKING IN       _______________________________________________________________________________________________________________________________    KEEPING YOU SAFE    ** THE HEALTH AND SAFETY OF OUR PATIENTS, STAFF AND PHYSICIANS IS OUR TOP PRIORITY. TO REDUCE THE RISK OF POSSIBLE COVID-19 EXPOSURE WE ARE TAKING THE PROPER PRECAUTIONS LISTED BELOW    * NO VISITORS ARE PERMITTED IN THE CENTER. YOUR RESPONSIBLE ADULT WILL BE ASKED TO REMAIN IN THE CAR AND BE AVAILABLE VIA CELL PHONE.  (EXCEPTION: ONE RESPONSIBLE ADULT WILL BE PERMITTED FOR PATIENTS UNDER THE AGE OF 18 AND PATIENTS REQUIRING SPECIAL NEEDS OR A CARETAKER)       * ALL PATIENTS AND THEIR RESPONSIBLE ADULT (IF ENTERING THE FACILITY) WILL BE SCREENED ON THE DATE OF SERVICE UPON ARRIVAL AND BE REQUIRED TO WEAR A MASK WHILE INSIDE  THE FACILITY.     * ALL PATIENTS MUST SELF QUARANTINE AFTER COVID-19 TESTING/SCREENING UNTIL THEIR SCHEDULED PROCEDURE.     * NOTIFY YOUR SURGEON IF YOU HAVE ANY SIGNIFICANT HEALTH STATUS CHANGES OR SHOULD YOU BECOME ILL PRIOR TO SURGERY WITH FEVER (TEMP 100.4 FAHRENHEIT OR GREATER) AND/OR COUGH, DIFFICULTY BREATHING, CHILLS, MUSCLE PAIN, HEADACHE, SORE THROAT OR NEW ONSET LOSS OF TASTE OR SMELL, OR NEW SUSPECTED EXPOSURE TO COVID-19.    ___________________________________________________________________________________    COVID-19 TESTING    You are scheduled for a Covid-19 test at the [facility] location (listed below) on  08/19/18  at  6:20 pm.      * UPON ARRIVAL, FOLLOW SIGNS AND PARK YOUR VEHICLE IN DESIGNATED AREA. PLEASE REMAIN IN YOUR VEHICLE AT ALL TIMES AND CALL PHONE NUMBER LISTED BELOW TO CHECK IN.     * ALL PATIENTS MUST SELF QUARANTINE AFTER COVID-19 TESTING/SCREENING UNTIL THEIR SCHEDULED PROCEDURE.     [facility]     [address]   [place], [place] [address]  [phone]            Pre-Admissions Testing (PAT)  [facility] [place] [email or web address]  [address]   [place], [place] [address]  [phone] (PAT NURSE)  [phone] (PAT NURSE)  [email or web address]  An Affiliate of [facility]

## 2018-08-19 NOTE — Progress Notes
Patient arrived to [facility] clinic for COVID-19 testing 08/19/18 1800. Patient identity confirmed via photo I.D. Nasopharyngeal procedure explained to the patient.   Nasopharyngeal swab completed left  Patient education provided given and instructed patient self isolate until contacted w/ results and further instructions.   Swab collected by Debroah Loop.    Date symptoms began/reason for testing: Preop

## 2018-08-21 LAB — COVID-19 (SARS-COV-2) PCR

## 2018-08-21 NOTE — H&P (View-Only)
History and Physical Update Note    Allergies:  Patient has no known allergies.    Lab/Radiology/Other Diagnostic Tests:  24-hour labs:  No results found for this visit on 08/22/18 (from the past 24 hour(s)).  Point of Care Testing:  (Last 24 hours):         I have examined the patient, and there are no significant changes in their condition, from the previous H&P performed on 08/21/18. Proceed to OR for below stated procedure. Patient marked and consent signed.     Gary Pinks, PA-C  Pager  [phone]    --------------------------------------------------------------------------------------------------------------------------------------------  Orthopedic History and Physical Exam       Admission:  08/22/18    Chief Complaint:  Left shoulder pain    Assessment and Plan:  1. Left shoulder rotator cuff tear, AC joint arthrosis    Plan:  To proceed with a Left shoulder arthroscopy with subacromial decompression, acromioplasty, distal clavicle excision, mini open rotator cuff repair and all other indicated procedures.  The risks and complications of surgical intervention were discussed with the patient including the risk of infection, injury of nerve or vessel, infection, DVT, hardware failure, graft failure, fracture, anesthetic complications, persistent pain or stiffness, and possible need for further surgery.  He understands the risks and would like to proceed with surgery.      History of the Present Illness:    Gary Mendonca. is a 58 y.o. male who presents with complaints of Left shoulder pain after a weightlifting injury. He also complains of significant shoulder weakness. MRI of the left shoulder demonstrates:  1.  Acute or subacute massive full-thickness, near full width tearing of the supraspinatus and infraspinatus tendons at the footprint attachments with medial retraction of the torn fibers to the glenohumeral joint line.  2.  Distal clavicle is chronically displaced superiorly with respect to the undersurface of the acromion and is likely sequela of remote tearing of the superior and inferior acromioclavicular ligaments.  The coracoclavicular distance and the coracoclavicular ligament are intact.  3.  Subacromial spur predisposes the patient to subacromial impingement.   He has failed conservative measures.    History reviewed. No pertinent past medical history.    Surgical History:   Procedure Laterality Date   ??? KNEE CARTILAGE SURGERY Left        Family History:  Noncontributory    Social History     Socioeconomic History   ??? Marital status: Married     Spouse name: Not on file   ??? Number of children: Not on file   ??? Years of education: Not on file   ??? Highest education level: Not on file   Occupational History   ??? Not on file   Tobacco Use   ??? Smoking status: Never Smoker   ??? Smokeless tobacco: Never Used   Substance and Sexual Activity   ??? Alcohol use: Never     Frequency: Never   ??? Drug use: Never   ??? Sexual activity: Not on file   Other Topics Concern   ??? Not on file   Social History Narrative   ??? Not on file       Allergies:  Patient has no known allergies.    No current facility-administered medications for this encounter.      No current outpatient medications on file.       Review of Systems -   All other systems reviewed and are negative.    Physical Exam:  General:  This is a well developed, well nourished person, sitting in no acute distress, alert and oriented x 3, head normocephalic  Eyes:  PERRLA, EOMI  Nares:  Patent  Throat:  nonerythematous  Neck:  Supple  Heart: RRR, no M/G/R/C  Lungs:  CTA bil  Abdomen:  Soft, nontender  GU:  Deferred  MS: The left shoulder was evaluated.  ???  Skin intact. ???The SC joint is is not???tender to palpation. ???[facility] Joint is is not???tender palpation. No TTP over bicipital groove.  ???  ???Range of Motion Exam  Full AROM, mild pain at extremes of ROM  ???  Rotator Cuff Exam  RTC strength: ???Supraspinatus 4/5 ??????Infraspinatus 3/5 ??????Subscapularis 5/5 Pain with rotator cuff testing positive.  positive???Neer/Hawkins impingement sign.  ???  AC Exam  negative cross shoulder adduction test.  AC joint prominence???is not???present.  ???  Long head of biceps/Superior Labrum???  negative???Speeds test  negative???O'Brien's test  negative???Yergason's test   ???  Stability  Anterior load and shift negative???   Posterior load and shift negative???  Sulcus sign absent  Apprehension Test negative???  ???  Sensation is intact to light-touch throughout the entire extremity. There is a palpable radial pulse.  Neuro:  Neurovascularly intact    Lab/Radiology/Other Diagnostic Tests:    CBC w/Diff   No results found for: WBC, HGB, HCT, PLTCT        Basic Metabolic Profile   No results found for: NA, K, CL, CO2, GAP, BUN, CR, GLU     Coagulation Studies   No results found for: PT, PTT, INR         Gary Pinks, PA-C  [phone]

## 2018-08-21 NOTE — Progress Notes
Contacted patient and confirmed name and DOB. Patient advised that COVID-19 test results are negative. Advised that patient can continue with the procedure and should follow pre-procedure instructions. Advised that if they develop any concerning symptoms prior to the procedure to contact their procedure team, specialist, and/or PCP for assistance.

## 2018-08-21 NOTE — Progress Notes
Unable to reach patient at this time.  Left call back number [phone] and will try again soon.

## 2018-08-22 ENCOUNTER — Encounter: Admit: 2018-08-22 | Discharge: 2018-08-22

## 2018-08-22 MED ORDER — HALOPERIDOL LACTATE 5 MG/ML IJ SOLN
1 mg | Freq: Once | INTRAVENOUS | 0 refills | Status: AC | PRN
Start: 2018-08-22 — End: ?

## 2018-08-22 MED ORDER — ONDANSETRON HCL 4 MG PO TAB
4 mg | ORAL_TABLET | ORAL | 0 refills | 8.00000 days | Status: DC | PRN
Start: 2018-08-22 — End: 2018-08-30
  Filled 2018-08-22: qty 30, 3d supply, fill #1

## 2018-08-22 MED ORDER — EPINEPHRINE 3MG LR 3000 ML IRR (OR)
0 refills | Status: DC
Start: 2018-08-22 — End: 2018-08-27

## 2018-08-22 MED ORDER — ONDANSETRON HCL (PF) 4 MG/2 ML IJ SOLN
INTRAVENOUS | 0 refills | Status: DC
Start: 2018-08-22 — End: 2018-08-22

## 2018-08-22 MED ORDER — FENTANYL CITRATE (PF) 50 MCG/ML IJ SOLN
0 refills | Status: DC
Start: 2018-08-22 — End: 2018-08-22

## 2018-08-22 MED ORDER — LIDOCAINE (PF) 10 MG/ML (1 %) IJ SOLN
.1-2 mL | INTRAMUSCULAR | 0 refills | Status: DC | PRN
Start: 2018-08-22 — End: 2018-08-27

## 2018-08-22 MED ORDER — LIDOCAINE (PF) 200 MG/10 ML (2 %) IJ SYRG
0 refills | Status: DC
Start: 2018-08-22 — End: 2018-08-22

## 2018-08-22 MED ORDER — PROPOFOL INJ 10 MG/ML IV VIAL
0 refills | Status: DC
Start: 2018-08-22 — End: 2018-08-22

## 2018-08-22 MED ORDER — ONDANSETRON HCL (PF) 4 MG/2 ML IJ SOLN
4 mg | Freq: Once | INTRAVENOUS | 0 refills | Status: AC | PRN
Start: 2018-08-22 — End: ?

## 2018-08-22 MED ORDER — ROPIVACAINE (PF) 5 MG/ML (0.5 %) IJ SOLN
0 refills | Status: CP
Start: 2018-08-22 — End: ?

## 2018-08-22 MED ORDER — FENTANYL CITRATE (PF) 50 MCG/ML IJ SOLN
50 ug | INTRAVENOUS | 0 refills | Status: DC | PRN
Start: 2018-08-22 — End: 2018-08-27

## 2018-08-22 MED ORDER — MIDAZOLAM 1 MG/ML IJ SOLN
INTRAVENOUS | 0 refills | Status: DC
Start: 2018-08-22 — End: 2018-08-22

## 2018-08-22 MED ORDER — LIDOCAINE (PF) 10 MG/ML (1 %) IJ SOLN
SUBCUTANEOUS | 0 refills | Status: CP
Start: 2018-08-22 — End: ?

## 2018-08-22 MED ORDER — MEPERIDINE (PF) 25 MG/ML IJ SYRG
12.5 mg | INTRAVENOUS | 0 refills | Status: DC | PRN
Start: 2018-08-22 — End: 2018-08-27

## 2018-08-22 MED ORDER — ONDANSETRON 4 MG PO TBDI
4 mg | Freq: Once | ORAL | 0 refills | Status: AC | PRN
Start: 2018-08-22 — End: ?

## 2018-08-22 MED ORDER — HYDROCODONE-ACETAMINOPHEN 10-325 MG PO TAB
1-2 | ORAL_TABLET | ORAL | 0 refills | 30.00000 days | Status: DC | PRN
Start: 2018-08-22 — End: 2018-08-30
  Filled 2018-08-22: qty 50, 5d supply, fill #1

## 2018-08-22 MED ORDER — FENTANYL CITRATE (PF) 50 MCG/ML IJ SOLN
25 ug | INTRAVENOUS | 0 refills | Status: DC | PRN
Start: 2018-08-22 — End: 2018-08-27

## 2018-08-22 MED ORDER — MIDAZOLAM 1 MG/ML IJ SOLN
INTRAVENOUS | 0 refills | Status: CP
Start: 2018-08-22 — End: ?

## 2018-08-22 MED ORDER — NAPROXEN 500 MG PO TAB
500 mg | ORAL_TABLET | Freq: Two times a day (BID) | ORAL | 0 refills | Status: DC
Start: 2018-08-22 — End: 2018-08-30
  Filled 2018-08-22: qty 60, 30d supply, fill #1

## 2018-08-22 MED ORDER — CEFAZOLIN 1 GRAM IJ SOLR
2 g | Freq: Once | INTRAVENOUS | 0 refills | Status: CP
Start: 2018-08-22 — End: ?

## 2018-08-22 MED ORDER — DEXAMETHASONE SODIUM PHOSPHATE 4 MG/ML IJ SOLN
INTRAVENOUS | 0 refills | Status: DC
Start: 2018-08-22 — End: 2018-08-22

## 2018-08-22 MED ORDER — PROMETHAZINE 25 MG/ML IJ SOLN
6.25 mg | INTRAVENOUS | 0 refills | Status: DC | PRN
Start: 2018-08-22 — End: 2018-08-27

## 2018-08-22 MED ORDER — SUCCINYLCHOLINE CHLORIDE 20 MG/ML IJ SOLN
INTRAVENOUS | 0 refills | Status: DC
Start: 2018-08-22 — End: 2018-08-22

## 2018-08-22 MED ORDER — OXYCODONE 5 MG PO TAB
5-10 mg | Freq: Once | ORAL | 0 refills | Status: AC | PRN
Start: 2018-08-22 — End: ?

## 2018-08-22 MED ORDER — LACTATED RINGERS IV SOLP
INTRAVENOUS | 0 refills | Status: DC
Start: 2018-08-22 — End: 2018-08-27

## 2018-08-22 MED ORDER — SUGAMMADEX 100 MG/ML IV SOLN
INTRAVENOUS | 0 refills | Status: DC
Start: 2018-08-22 — End: 2018-08-22

## 2018-08-22 MED ORDER — MIDAZOLAM 1 MG/ML IJ SOLN
2 mg | Freq: Once | INTRAVENOUS | 0 refills | Status: DC
Start: 2018-08-22 — End: 2018-08-27

## 2018-08-22 MED ORDER — DEXAMETHASONE SODIUM PHOSPHATE 10 MG/ML IJ SOLN
0 refills | Status: CP
Start: 2018-08-22 — End: ?

## 2018-08-22 MED ORDER — ROCURONIUM 10 MG/ML IV SOLN
INTRAVENOUS | 0 refills | Status: DC
Start: 2018-08-22 — End: 2018-08-22

## 2018-08-22 NOTE — Anesthesia Procedure Notes
Anesthesia Procedure: Peripheral Nerve Block    PERIPHERAL NERVE BLOCK  Date/Time: 08/22/2018 8:05 AM    Patient location: pre-op  Reason for block: at surgeon's request and post-op pain management    Preprocedure checklist performed: 2 patient identifiers, risks & benefits discussed, patient evaluated, timeout performed, consent obtained, patient being monitored and sterile drape    Sterile technique:  - Proper hand washing  - Cap, mask  - Sterile gloves  - Skin prep for antisepsis        Peripheral Nerve Block Procedure   Patient position: supine  Prep: ChloraPrep    Monitoring: BP, EKG and continuous pulse ox  Block type: interscalene  Laterality: left  Injection technique: single-shot  Procedures: ultrasound guided      Needle/cathether:      Needle type: Stimuplex      Needle gauge: 22 G; Needle length: 2 in     Needle location: anatomical landmarks and ultrasound guidance    Procedure Medications  Sedation: midazolam (VERSED) 1 mg/mL injection, 2 mg  Local Anesthesia: lidocaine PF 1% (10 mg/mL) injection, 3 mL  Bolus Dose: ropivacaine (PF) 0.5% (NAROPIN) injection, 30 mL  Adjuvant Medications: dexamethasone (DECADRON) 10 mg/mL injection, 4 mg    Procedure Outcome   Injection assessment: negative aspiration for heme, no paresthesia on injection, incremental injection and local visualized surrounding nerve on ultrasound  Observations: adequate block, patient sedated but conversant throughout block, patient tolerated the procedure well with no immediate complications and comfortable throughout block      Refer to nursing documentation for vitals and monitoring data during procedure.    Performed by: Ilda Foil, MD  Authorized by: Ilda Foil, MD

## 2018-08-22 NOTE — Anesthesia Post-Procedure Evaluation
Post-Anesthesia Evaluation    Name: Gary Clarke.      MRN: [id number]     DOB: Nov 15, 1960     Age: 58 y.o.     Sex: male   __________________________________________________________________________     Procedure Date: 08/22/2018  Procedure(s) (LRB):  ARTHROSCOPY SHOULDER WITH SUBACROMIAL DECOMPRESSION, ACROMIOPLASTY, DISTAL CLAVICLE EXCISION, BICEPS TENOTOMY, LABRAL REPAIR, MINI OPEN ROTATOR CUFF REPAIR (Left)      Surgeon: Surgeon(s):  Key, Starla Link, MD  Clovis Riley., MD    Post-Anesthesia Vitals  BP: 138/95 (07/02 1200)  Temp: 36.6 C (97.9 F) (07/02 1054)  Pulse: 66 (07/02 1200)  Respirations: 12 PER MINUTE (07/02 1200)  SpO2: 100 % (07/02 1200)  SpO2 Pulse: 66 (07/02 1200)  Height: 180.3 cm (71") (07/02 0600)   Vitals Value Taken Time   BP 138/95 08/22/2018 12:00 PM   Temp 36.6 C (97.9 F) 08/22/2018 10:54 AM   Pulse 66 08/22/2018 12:00 PM   Respirations 12 PER MINUTE 08/22/2018 12:00 PM   SpO2 100 % 08/22/2018 12:00 PM         Post Anesthesia Evaluation Note    Evaluation location: pre/post  Patient participation: recovered; patient participated in evaluation  Level of consciousness: alert    Pain score: 0  Pain management: adequate    Hydration: normovolemia  Temperature: 36.0C - 38.4C  Airway patency: adequate    Regional/Neuraxial:       Neurological status: sensory deficit and motor deficit      Single injection shot performed    Perioperative Events       Post-op nausea and vomiting: no PONV    Postoperative Status  Cardiovascular status: hemodynamically stable  Respiratory status: spontaneous ventilation        Perioperative Events  Perioperative Event: No  Emergency Case Activation: No

## 2018-08-22 NOTE — Progress Notes
Patient disconnected from monitors to use restroom  Vital signs stable.  Returned to bay and was ready to be d/c.

## 2018-08-22 NOTE — Other
Brief Operative Note    Name: Gary Clarke. is a 57 y.o. 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 Item Serial No. Manufacturer Lot No. LRB No. Used   GRYPHON ANCHOR   NA  [id number] Left 2   GRYPHON ANCHOR   NA  [id number] Left 2   [employer] PEEK [employer]CH[employer] SPEEDBRIDGE   NA  [id number] Left 1         Drains: None    Disposition:  PACU - stable    Synetta Fail, PA-C  Pager  [phone]

## 2018-08-23 ENCOUNTER — Encounter: Admit: 2018-08-22 | Discharge: 2018-08-23

## 2018-08-23 ENCOUNTER — Encounter: Admit: 2018-08-23 | Discharge: 2018-08-23

## 2018-08-23 DIAGNOSIS — S46012A Strain of muscle(s) and tendon(s) of the rotator cuff of left shoulder, initial encounter: Secondary | ICD-10-CM

## 2018-08-23 DIAGNOSIS — M19012 Primary osteoarthritis, left shoulder: Secondary | ICD-10-CM

## 2018-08-26 ENCOUNTER — Encounter: Admit: 2018-08-26 | Discharge: 2018-08-26

## 2018-08-26 DIAGNOSIS — M7542 Impingement syndrome of left shoulder: Secondary | ICD-10-CM

## 2018-08-26 DIAGNOSIS — M19012 Primary osteoarthritis, left shoulder: Secondary | ICD-10-CM

## 2018-08-26 DIAGNOSIS — M67922 Unspecified disorder of synovium and tendon, left upper arm: Secondary | ICD-10-CM

## 2018-08-26 DIAGNOSIS — S46012A Strain of muscle(s) and tendon(s) of the rotator cuff of left shoulder, initial encounter: Secondary | ICD-10-CM

## 2018-08-26 DIAGNOSIS — M25512 Pain in left shoulder: Secondary | ICD-10-CM

## 2018-08-26 NOTE — Telephone Encounter
Post-op physical therapy orders/protocol faxed to patients preferred location.    [facility]  fax: [phone]

## 2018-08-28 ENCOUNTER — Encounter: Admit: 2018-08-28 | Discharge: 2018-08-28

## 2018-08-28 NOTE — Telephone Encounter
Pt's wife called office regarding pt's inability to sleep which she believes is causing headaches for him.  She states his pain is controlled.  She was encouraged to start with Melatonin.  Also encouraged ice, staying hydrated and if headache persists to reach out to his PCP for further care.   She verbalized understanding.

## 2018-08-30 ENCOUNTER — Ambulatory Visit: Admit: 2018-08-30 | Discharge: 2018-08-31

## 2018-08-30 ENCOUNTER — Encounter: Admit: 2018-08-30 | Discharge: 2018-08-30

## 2018-08-30 DIAGNOSIS — Z9889 Other specified postprocedural states: Secondary | ICD-10-CM

## 2018-08-30 NOTE — Progress Notes
SUBJECTIVE:  Gary Clarke. presents today for post operative follow up on his Left shoulder arthroscopy with subacromial decompression, acromioplasty, distal clavicle excision, biceps tenotomy, labral repair, mini open rotator cuff repair.  he is now about 1 week out from surgery.  He is doing well with minimal complaints. Pain is well controlled with current Rx. he has been wearing a shoulder immobilizer as instructed. Denies any adverse events.    Surgical History:   Procedure Laterality Date    ARTHROSCOPY SHOULDER WITH SUBACROMIAL DECOMPRESSION, ACROMIOPLASTY, DISTAL CLAVICLE EXCISION, BICEPS TENOTOMY, LABRAL REPAIR, MINI OPEN ROTATOR CUFF REPAIR Left 08/22/2018    Performed by Judith Blonder, MD at [facility]        No current outpatient medications on file.    OBJECTIVE:  Vitals:    08/30/18 0829   BP: (!) 145/91   Pulse: 61       Left Shoulder was evaluated.  Wounds clean and dry, healing nicely.  No evidence of drainage or erythema noted. Swelling and bruising WNL related to surgery. ROM not assessed. RTC MS not assessed.  Neurovascularly intact.    ASSESSMENT:    ICD-9-CM ICD-10-CM    1. S/P arthroscopy of left shoulder V45.89 Z98.890    2. S/P rotator cuff repair V45.89 Z98.890        2.  58 y.o. male 1 week S/p Left shoulder arthroscopy with subacromial decompression, acromioplasty, distal clavicle excision, biceps tenotomy, labral repair, mini open rotator cuff repair    PLAN:  Dressings removed, steri-strips reapplied over wounds  Instructed on wound care  Ice, Naproxen  Continue shoulder immobilizer for another 2 weeks  Continue current pain control Rx as needed  F/u in 3 weeks for repeat evaluation    Synetta Fail, PA-C

## 2018-08-30 NOTE — Patient Instructions
General Instructions:  Vincent Key MD and Shwanda Soltis PA-C     How to reach me:   Please send a MyChart message to the Orthopedic Surgery clinic or call our nursing line at [phone].   Fax number: [phone]   How to get a medication refill:  Please contact your pharmacy directly to request medication refills. Please allow 48 hours.      How to receive your test results:  If you have signed up for MyChart, you will receive your test results this way.  Results will be reviewed with a provider in the office.     Scheduling:  Our Scheduling phone number is [phone].     Appointment Reminders on your cell phone: Make sure we have your cell phone number, and Text [place] to [phone].            Please contact our scheduling department to schedule your follow up visit at [phone]

## 2018-09-03 ENCOUNTER — Encounter: Admit: 2018-09-03 | Discharge: 2018-09-03

## 2018-09-03 NOTE — Progress Notes
Surgery billing sheet completed, scanned and emailed to billing staff for processing.

## 2018-09-20 ENCOUNTER — Encounter: Admit: 2018-09-20 | Discharge: 2018-09-20

## 2018-09-20 ENCOUNTER — Ambulatory Visit: Admit: 2018-09-20 | Discharge: 2018-09-21

## 2018-09-20 DIAGNOSIS — Z9889 Other specified postprocedural states: Secondary | ICD-10-CM

## 2018-09-20 NOTE — Progress Notes
Date of Service: 09/20/2018     Subjective:           Patient is here for follow-up left shoulder status post left shoulder arthroscopy rotator cuff repair labral repair subacromial decompression acromioplasty and an open distal clavicle excision.  He is approximately 4 weeks out from surgery.  He is doing well minimal complaints.      History of Present Illness  Gary Clarke. is a 58 y.o. male.   Chief Complaint   Patient presents with    Left Shoulder - Follow Up          Review of Systems   All other systems reviewed and are negative.      History reviewed. No pertinent past medical history.  Surgical History:   Procedure Laterality Date    ARTHROSCOPY SHOULDER WITH SUBACROMIAL DECOMPRESSION, ACROMIOPLASTY, DISTAL CLAVICLE EXCISION, BICEPS TENOTOMY, LABRAL REPAIR, MINI OPEN ROTATOR CUFF REPAIR Left 08/22/2018    Performed by Vonita Calloway, Starla Link, MD at [facility]      ALLERGIES:  Patient has no known allergies.      Objective:         No current outpatient medications on file.     Vitals:    09/20/18 1156   BP: 139/89   Pulse: 73   Weight: 87.5 kg (193 lb)   Height: 180.3 cm (71")   PainSc: Zero     Body mass index is 26.92 kg/m.     Physical Exam  Ortho Exam  Left shoulder: Incisional sites healing well without any erythema or signs of infection.  Otherwise neurovascular intact.       Assessment and Plan:  Assessment: 4 weeks status post left shoulder arthroscopy rotator cuff repair labral repair subacromial decompression/acromioplasty with an open distal clavicle excision.    Plan: We will have him start physical therapy along with ice and nonsteroidal anti-inflammatory medications as needed.  Patient will see me back in 6 weeks for follow-up.

## 2018-09-20 NOTE — Patient Instructions
General Instructions:  Vincent Key MD and David Silkman PA-C     How to reach me:   Please send a MyChart message to the Orthopedic Surgery clinic or call our nursing line at [phone].   Fax number: [phone]   How to get a medication refill:  Please contact your pharmacy directly to request medication refills. Please allow 48 hours.      How to receive your test results:  If you have signed up for MyChart, you will receive your test results this way.  Results will be reviewed with a provider in the office.     Scheduling:  Our Scheduling phone number is [phone].     Appointment Reminders on your cell phone: Make sure we have your cell phone number, and Text [place] to [phone].            Please contact our scheduling department to schedule your follow up visit at [phone]

## 2018-10-01 ENCOUNTER — Emergency Department (HOSPITAL_BASED_OUTPATIENT_CLINIC_OR_DEPARTMENT_OTHER)
Admission: EM | Admit: 2018-10-01 | Discharge: 2018-10-01 | Disposition: A | Discharge status: Home / Self Care | Payer: Medicaid Other | Attending: Emergency Medicine | Admitting: Emergency Medicine

## 2018-10-01 ENCOUNTER — Other Ambulatory Visit: Payer: Self-pay

## 2018-10-01 ENCOUNTER — Emergency Department (HOSPITAL_BASED_OUTPATIENT_CLINIC_OR_DEPARTMENT_OTHER): Payer: Medicaid Other

## 2018-10-01 ENCOUNTER — Encounter (HOSPITAL_BASED_OUTPATIENT_CLINIC_OR_DEPARTMENT_OTHER): Payer: Self-pay | Admitting: *Deleted

## 2018-10-01 DIAGNOSIS — Z87891 Personal history of nicotine dependence: Secondary | ICD-10-CM | POA: Insufficient documentation

## 2018-10-01 DIAGNOSIS — K9429 Other complications of gastrostomy: Secondary | ICD-10-CM | POA: Insufficient documentation

## 2018-10-01 DIAGNOSIS — C140 Malignant neoplasm of pharynx, unspecified: Secondary | ICD-10-CM | POA: Diagnosis not present

## 2018-10-01 DIAGNOSIS — R1013 Epigastric pain: Secondary | ICD-10-CM | POA: Diagnosis present

## 2018-10-01 DIAGNOSIS — Z79899 Other long term (current) drug therapy: Secondary | ICD-10-CM | POA: Diagnosis not present

## 2018-10-01 LAB — CBC WITH DIFFERENTIAL/PLATELET
Abs Immature Granulocytes: 0.06 10*3/uL (ref 0.00–0.07)
Basophils Absolute: 0.1 10*3/uL (ref 0.0–0.1)
Basophils Relative: 1 %
Eosinophils Absolute: 0.1 10*3/uL (ref 0.0–0.5)
Eosinophils Relative: 1 %
HCT: 28.1 % — ABNORMAL LOW (ref 39.0–52.0)
Hemoglobin: 9.4 g/dL — ABNORMAL LOW (ref 13.0–17.0)
Immature Granulocytes: 1 %
Lymphocytes Relative: 4 %
Lymphs Abs: 0.4 10*3/uL — ABNORMAL LOW (ref 0.7–4.0)
MCH: 30.5 pg (ref 26.0–34.0)
MCHC: 33.5 g/dL (ref 30.0–36.0)
MCV: 91.2 fL (ref 80.0–100.0)
Monocytes Absolute: 0.9 10*3/uL (ref 0.1–1.0)
Monocytes Relative: 9 %
Neutro Abs: 8.7 10*3/uL — ABNORMAL HIGH (ref 1.7–7.7)
Neutrophils Relative %: 84 %
Platelets: 263 10*3/uL (ref 150–400)
RBC: 3.08 MIL/uL — ABNORMAL LOW (ref 4.22–5.81)
RDW: 14.8 % (ref 11.5–15.5)
WBC: 10.2 10*3/uL (ref 4.0–10.5)
nRBC: 0 % (ref 0.0–0.2)

## 2018-10-01 LAB — COMPREHENSIVE METABOLIC PANEL
ALT: 13 U/L (ref 0–44)
AST: 22 U/L (ref 15–41)
Albumin: 2.5 g/dL — ABNORMAL LOW (ref 3.5–5.0)
Alkaline Phosphatase: 113 U/L (ref 38–126)
Anion gap: 10 (ref 5–15)
BUN: 10 mg/dL (ref 6–20)
CO2: 27 mmol/L (ref 22–32)
Calcium: 8.2 mg/dL — ABNORMAL LOW (ref 8.9–10.3)
Chloride: 97 mmol/L — ABNORMAL LOW (ref 98–111)
Creatinine, Ser: 0.56 mg/dL — ABNORMAL LOW (ref 0.61–1.24)
GFR calc Af Amer: >60 mL/min (ref >60)
GFR calc non Af Amer: >60 mL/min (ref >60)
Glucose, Bld: 87 mg/dL (ref 70–99)
Potassium: 3.9 mmol/L (ref 3.5–5.1)
Sodium: 134 mmol/L — ABNORMAL LOW (ref 135–145)
Total Bilirubin: 0.7 mg/dL (ref 0.3–1.2)
Total Protein: 5.5 g/dL — ABNORMAL LOW (ref 6.5–8.1)

## 2018-10-01 LAB — LIPASE, BLOOD: Lipase: 34 U/L (ref 11–51)

## 2018-10-01 LAB — MAGNESIUM: Magnesium: 1.8 mg/dL (ref 1.7–2.4)

## 2018-10-01 MED ORDER — HYDROMORPHONE HCL 1 MG/ML IJ SOLN
0.5000 mg | Freq: Once | INTRAMUSCULAR | Status: AC
  Administered 2018-10-01: 0.5 mg via INTRAVENOUS
  Filled 2018-10-01: qty 1

## 2018-10-01 MED ORDER — ONDANSETRON HCL 4 MG/2ML IJ SOLN
4.0000 mg | Freq: Once | INTRAMUSCULAR | Status: AC
  Administered 2018-10-01: 4 mg via INTRAVENOUS
  Filled 2018-10-01: qty 2

## 2018-10-01 MED ORDER — HYDROCODONE-ACETAMINOPHEN 5-325 MG PO TABS: 1.0000 | ORAL_TABLET | Freq: Four times a day (QID) | ORAL | 0 refills | Status: AC | PRN

## 2018-10-01 MED ORDER — SODIUM CHLORIDE 0.9 % IV SOLN
INTRAVENOUS | Status: DC
  Administered 2018-10-01: 18:00:00 via INTRAVENOUS

## 2018-10-01 NOTE — ED Notes (Signed)
Pt port de accessed. Pt tolerated well. No bleeding.

## 2018-10-01 NOTE — Discharge Instructions (Addendum)
Make an appointment to follow-up with your doctors in the [facility] area.  Today's labs show some improvement from your admission.  Use the hydrocodone in your feeding tube for pain.  Use the Xeroform gauze around the base of the feeding tube to help protect the skin.

## 2018-10-01 NOTE — ED Notes (Signed)
Family at bedside. 

## 2018-10-01 NOTE — ED Provider Notes (Addendum)
[facility] Provider Note   CSN: [id number] Arrival date & time: 10/01/18  1707     History   Chief Complaint Chief Complaint  Patient presents with  . Abdominal Pain    HPI Ian Richard is a 58 y.o. male.     Patient has throat cancer.  Patient followed at [facility].  Patient with recent admission.  He was admitted August 3 through August 8.  Failure to thrive electrolyte abnormalities.  Some question about cardiac arrest.  Patient's had longstanding gastrostomy tube in the upper part of the abdomen.  They did note during that admission that he had skin breakdown.  And also they feel he had a GI bleed.  Patient is followed by hematology oncology in the [place] area.  Also followed by ear nose and throat in that area.  Patient also had questionable pneumonia.  Patient is complaining of pain around the feeding tube and a drainage of coffee-ground material.  Also with some burning in the epigastric area.  According to his discharge instruction patient was to have repeat labs within 1 week and was to follow back up with hematology oncology in 1 to 2 weeks.     Past Medical History:  Diagnosis Date  . Left inguinal hernia   . Throat cancer ([place])     There are no active problems to display for this patient.   Past Surgical History:  Procedure Laterality Date  . APPENDECTOMY    . GASTROSTOMY W/ FEEDING TUBE    . THROAT BX    . TOOTH EXTRACTION Bilateral 05/03/2018   Procedure: MULTIPLE EXTRACTIONS;  Surgeon: Diona Browner, DDS;  Location: [facility];  Service: Oral Surgery;  Laterality: Bilateral;  MULTIPLE EXTRACTIONS        Home Medications    Prior to Admission medications   Medication Sig Start Date End Date Taking? Authorizing Provider  acetaminophen (TYLENOL) 500 MG tablet Place 500-1,000 mg into feeding tube every 6 (six) hours as needed.    [provider]  amoxicillin (AMOXIL) 400 MG/5ML  suspension Take 5 mLs (400 mg total) by mouth 2 (two) times daily. 05/03/18   Diona Browner, DDS  amoxicillin (AMOXIL) 500 MG capsule Take 1 capsule (500 mg total) by mouth 3 (three) times daily. 05/03/18   Diona Browner, DDS  furosemide (LASIX) 40 MG tablet Take 1 tablet (40 mg total) by mouth daily. 07/07/18   Veryl Speak, MD  HYDROcodone-acetaminophen (NORCO/VICODIN) 5-325 MG tablet Take 1 tablet by mouth every 6 (six) hours as needed for moderate pain. 10/01/18   Fredia Sorrow, MD  magnesium hydroxide (MILK OF MAGNESIA) 400 MG/5ML suspension Take by mouth daily as needed for mild constipation.    [provider]  Multiple Vitamin (MULTIVITAMIN WITH MINERALS) TABS tablet Place 1 tablet into feeding tube daily.    [provider]  ondansetron (ZOFRAN) 8 MG tablet Take by mouth every 8 (eight) hours as needed for nausea or vomiting.    [provider]  oxyCODONE (ROXICODONE) 5 MG/5ML solution Take 5 mLs (5 mg total) by mouth every 4 (four) hours as needed for severe pain. 07/08/18   Davonna Belling, MD  oxyCODONE-acetaminophen (ROXICET) 5-325 MG/5ML solution Place 5 mLs into feeding tube every 4 (four) hours as needed for severe pain. 07/07/18   Veryl Speak, MD  potassium chloride SA (K-DUR) 20 MEQ tablet Take 20 mEq by mouth 2 (two) times daily.    [provider]  Family History No family history on file.  Social History Social History   Tobacco Use  . Smoking status: Former Smoker    Quit date: 04/03/2018    Years since quitting: 0.4  . Smokeless tobacco: Former Network engineer Use Topics  . Alcohol use: Never    Frequency: Never  . Drug use: Never     Allergies   Bee venom and Hornet venom   Review of Systems Review of Systems  Constitutional: Negative for chills and fever.  HENT: Positive for congestion and trouble swallowing. Negative for rhinorrhea and sore throat.   Eyes: Negative for visual disturbance.  Respiratory: Negative  for cough and shortness of breath.   Cardiovascular: Negative for chest pain and leg swelling.  Gastrointestinal: Positive for abdominal pain. Negative for diarrhea, nausea and vomiting.  Genitourinary: Negative for dysuria.  Musculoskeletal: Negative for back pain and neck pain.  Skin: Negative for rash.  Neurological: Negative for dizziness, light-headedness and headaches.  Hematological: Does not bruise/bleed easily.  Psychiatric/Behavioral: Negative for confusion.     Physical Exam Updated Vital Signs BP 116/81   Pulse 89   Temp 97.8 F (36.6 C) (Oral)   Resp 20   Ht 1.702 m (5\' 7" )   Wt 50.8 kg   SpO2 96%   BMI 17.54 kg/m   Physical Exam Vitals signs and nursing note reviewed.  Constitutional:      General: He is not in acute distress.    Appearance: He is well-developed. He is ill-appearing. He is not toxic-appearing.     Comments: Patient very thin.  Cachectic.  HENT:     Head: Normocephalic and atraumatic.     Mouth/Throat:     Mouth: Mucous membranes are moist.  Eyes:     Conjunctiva/sclera: Conjunctivae normal.     Pupils: Pupils are equal, round, and reactive to light.  Neck:     Musculoskeletal: Normal range of motion and neck supple.  Cardiovascular:     Rate and Rhythm: Normal rate and regular rhythm.     Heart sounds: No murmur.  Pulmonary:     Effort: Pulmonary effort is normal. No respiratory distress.     Breath sounds: Normal breath sounds.  Abdominal:     Palpations: Abdomen is soft.     Tenderness: There is no abdominal tenderness.     Comments: Gastrostomy tube left side of the abdomen.  Has skin breakdown around the base of the tube probably measuring about 2 cm.  No signs of infection.  There is maybe some questionable coffee-ground residual stomach.  But the feeding tube works.  And has clear fluid now.  Musculoskeletal: Normal range of motion.  Skin:    General: Skin is warm and dry.  Neurological:     General: No focal deficit present.      Mental Status: He is alert and oriented to person, place, and time.      ED Treatments / Results  Labs (all labs ordered are listed, but only abnormal results are displayed) Labs Reviewed  COMPREHENSIVE METABOLIC PANEL - Abnormal; Notable for the following components:      Result Value   Sodium 134 (*)    Chloride 97 (*)    Creatinine, Ser 0.56 (*)    Calcium 8.2 (*)    Total Protein 5.5 (*)    Albumin 2.5 (*)    All other components within normal limits  CBC WITH DIFFERENTIAL/PLATELET - Abnormal; Notable for the following components:   RBC 3.08 (*)  Hemoglobin 9.4 (*)    HCT 28.1 (*)    Neutro Abs 8.7 (*)    Lymphs Abs 0.4 (*)    All other components within normal limits  LIPASE, BLOOD  MAGNESIUM    EKG None  Radiology Dg Chest Port 1 View  Result Date: 10/01/2018 CLINICAL DATA:  History of pneumonia. EXAM: PORTABLE CHEST 1 VIEW COMPARISON:  09/26/2018 FINDINGS: A right jugular Port-A-Cath terminates over the superior cavoatrial junction. The cardiomediastinal silhouette is within normal limits. Lung volumes are increased compared to the prior study. Left greater than right basilar airspace opacities have improved, likely with persistent small bilateral pleural effusions. There is no overt edema. No pneumothorax is identified. No acute osseous abnormality is seen. IMPRESSION: Improved lung aeration with residual left greater than right basilar atelectasis or infiltrates and small pleural effusions. Electronically Signed   By: Logan Bores M.D.   On: 10/01/2018 18:39    Procedures Procedures (including critical care time)  Medications Ordered in ED Medications  0.9 %  sodium chloride infusion ( Intravenous New Bag/Given 10/01/18 1810)  ondansetron (ZOFRAN) injection 4 mg (4 mg Intravenous Given 10/01/18 1821)  HYDROmorphone (DILAUDID) injection 0.5 mg (0.5 mg Intravenous Given 10/01/18 1822)     Initial Impression / Assessment and Plan / ED Course  I have reviewed  the triage vital signs and the nursing notes.  Pertinent labs & imaging results that were available during my care of the patient were reviewed by me and considered in my medical decision making (see chart for details).        Patient's chest x-ray here and labs actually very reassuring.  No significant EMEA no signs of any significant bleed.  Patient was given some Hydro morphine.  Small dose.  But it made him feel much better.  Feel a lot of his discomfort is from the skin breakdown around his gastrostomy tube.  No evidence of any significant GI bleed.  Electrolytes look much better.  Including his magnesium is looking good here today.  Feel patient can be discharged back close follow-up with his doctors as well as hematology oncology.  We will give him Xeroform to put around the G-tube to help protect the skin.  Nurse will instruct.  Also will give him a short course of hydrocodone to help with the pain until he can get back to hematology oncology.  Patient's database was reviewed.  Patient in May was receiving liquid narcotic medication.  Suspect he ran out.  Because patient states he has none at home.  Chest x-ray as well showed improvement in the pneumonia.  In addition patient's gastrostomy tube is functioning properly.  Final Clinical Impressions(s) / ED Diagnoses   Final diagnoses:  Epigastric pain  Throat cancer ([place])  Gastrostomy tube skin breakdown [facility])    ED Discharge Orders         Ordered    HYDROcodone-acetaminophen (NORCO/VICODIN) 5-325 MG tablet  Every 6 hours PRN     10/01/18 1941           Fredia Sorrow, MD 10/01/18 1948    Fredia Sorrow, MD 10/01/18 1949

## 2018-10-01 NOTE — ED Notes (Signed)
EDP at bedside with explanation of Pt. Having GI Bleed while he was in Hospital and the tube was having issues while he was in the hospital a week ago.  Pt. Asking for pain meds.

## 2018-10-01 NOTE — ED Notes (Signed)
Port accessed by [employer] M.  Pt. Has good blood return.  Pt. Has fluid running in port.

## 2018-10-01 NOTE — ED Triage Notes (Signed)
Abdominal pain. Brown drainage from his feeding tube. Hx of throat cancer. FYI pt is grabbing at the feeding tube and drainage with his hands and cross contaminating other objects near his hands.

## 2018-10-01 NOTE — ED Notes (Signed)
Pt. Reports feedings have been going in well with no problems.   Pt, has gauze at home for the peg tube where insertion site is but places what he want around the tube.

## 2018-10-01 NOTE — ED Notes (Addendum)
Pt. Reports he has had dark colored fluid running out of his Peg tube since this morning.  Pt. Reports his skin is burning  And he has been using his regular tube feedings at home.  Pt. Has had this tube in the lower L abd. For approx. 4 mths.  Pt. Spits up thick mucus constantly.  Pt. Tube is clear with skin noted red around insertion site.

## 2018-10-23 ENCOUNTER — Encounter: Admit: 2018-10-23 | Discharge: 2018-10-23

## 2018-10-23 NOTE — Telephone Encounter
Patients PT contacted office ([facility]) to ask for a letter of necessity to try and get more visits for patient. AT contacted insurance company and patient only has 20  visits per year. Raquel Sarna is going to try and send PT notes, letter, etc to see if more can be approved. Letter prepared, signed and faxed to Shawn at [phone]    [facility] cell number: [phone] if there are problems/concerns

## 2018-11-04 ENCOUNTER — Encounter: Admit: 2018-11-04 | Discharge: 2018-11-04

## 2018-11-04 ENCOUNTER — Ambulatory Visit: Admit: 2018-11-04 | Discharge: 2018-11-05 | Payer: Private Health Insurance - Indemnity

## 2018-11-04 NOTE — Patient Instructions
General Instructions:  Vincent Key MD and David Silkman PA-C     How to reach me:   Please send a MyChart message to the Orthopedic Surgery clinic or call our nursing line at [phone].   Fax number: [phone]   How to get a medication refill:  Please contact your pharmacy directly to request medication refills. Please allow 48 hours.      How to receive your test results:  If you have signed up for MyChart, you will receive your test results this way.  Results will be reviewed with a provider in the office.     Scheduling:  Our Scheduling phone number is [phone].     Appointment Reminders on your cell phone: Make sure we have your cell phone number, and Text [place] to [phone].            Please contact our scheduling department to schedule your follow up visit at [phone]

## 2018-11-21 DEATH — deceased

## 2019-02-17 ENCOUNTER — Encounter: Admit: 2019-02-17 | Discharge: 2019-02-17

## 2019-02-17 ENCOUNTER — Ambulatory Visit: Admit: 2019-02-17 | Discharge: 2019-02-17 | Payer: Private Health Insurance - Indemnity

## 2019-12-01 ENCOUNTER — Ambulatory Visit: Admit: 2019-12-01 | Discharge: 2019-12-01

## 2019-12-01 ENCOUNTER — Encounter: Admit: 2019-12-01 | Discharge: 2019-12-01

## 2019-12-01 ENCOUNTER — Ambulatory Visit: Admit: 2019-12-01 | Discharge: 2019-12-01 | Payer: BC Managed Care – PPO

## 2019-12-01 DIAGNOSIS — M25561 Pain in right knee: Secondary | ICD-10-CM

## 2019-12-01 DIAGNOSIS — M2391 Unspecified internal derangement of right knee: Secondary | ICD-10-CM

## 2019-12-01 NOTE — Patient Instructions
General Instructions:  Erlene Quan MD and Judene Companion PA-C    ? How to reach me:   Please send a MyChart message to the Orthopedic Surgery clinic or call our nursing line at [phone].   Fax number: [phone]  ? How to get a medication refill:  Please contact your pharmacy directly to request medication refills. Please allow 48 hours.     ? How to receive your test results:  If you have signed up for MyChart, you will receive your test results this way.  Results will be reviewed with a provider in the office.    ? Scheduling:  Our Scheduling phone number is [phone].    ? Appointment Reminders on your cell phone: Make sure we have your cell phone number, and Text [place] to [phone].      Please contact the Radiology Scheduling Department at [phone] to schedule your imaging. After you know your imaging appointment date, contact the scheduling office at [phone] to schedule a follow up appointment to go over your imaging results with your provider.      Please contact our scheduling department to schedule your follow up visit at [phone].

## 2019-12-02 ENCOUNTER — Encounter: Admit: 2019-12-02 | Discharge: 2019-12-02

## 2019-12-02 NOTE — Telephone Encounter
Pt's wife called office requesting MRI orders be faxed [facility] at [phone].  Orders and demos faxed.

## 2019-12-03 NOTE — Progress Notes
SUBJECTIVE:  Gary Clarke. presents to the office today in f/u of his right knee MRI results.  He localizes the majority of his pain along the medial aspect of his knee today.  He describes occasional mechanical symptoms, such as catching, but denies instability.  Intermittent swelling.  He has noted persistent pain and discomfort specifically over the past 2-3 months.  Denies any signs or symptoms of radiculopathy.  His pain is made worse with prolonged weight bearing and twisting activities.  He has recently tried ice, anti-inflammatories and strengthening exercises on his own, without relief.  Denies distant h/o serious right knee injury and/or dislocations.  He presents to clinic to pursue further management options.    OBJECTIVE:  PE:  Right knee was evaluated.  Skin intact.  ROM 0-130 degrees.  No effusion.  Stable to varus/valgus stress.  Negative lateral jt line tenderness. Positive medial jt line tenderness.  Positive medial McMurray test, negative lateral.  Negative lachman.  Negative anterior drawer.  Negative posterior drawer. Negative patellar apprehension test.  Positive retropatellar crepitus noted.  Quad MS 5/5.  Neurovascularly intact.    Imaging:  Orthogonal radiographs of his right knee were again reviewed from December 01, 2019.  No acute fracture or dislocation.  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  Trace right knee joint effusion.    An outside MRI of his right knee was reviewed, which indicated attenuation and multidirectional tearing of the posterior horn of the medial meniscus.  Mild distal quadriceps tendinosis and mild proximal patella tendinosis without tendon tearing.  7.0 mm focus of high-grade cartilage thinning at the junction of the median ridge and the lateral patella facet.  Diffuse high-grade trochlear cartilage thinning.  Small 5.0 x 4.0 mm focus of moderate to high-grade cartilage thinning at the junction of the central and inner weightbearing surfaces of the medial femoral condyle.    ASSESSMENT:    ICD-9-CM ICD-10-CM    1. Tear of medial meniscus of right knee, current, unspecified tear type, initial encounter  836.0 S83.241A        PLAN:  I discussed the patients history, physical exam, and imaging findings today.  We discussed the natural history of their problem.  We also discussed treatment options including operative and non-operative management.  Based on his MRI findings and symptomatology, he would like to proceed with surgical intervention.  His surgery will entail right knee arthroscopy, partial medial meniscectomy versus repair.  We discussed the details of the procedure including the recovery period.  We will schedule the procedure for a mutually convenient time and date.  All their questions were answered today and they were comfortable with the plan at the end of the visit.    The risks and benefits of surgical intervention were discussed with the patient and the procedure will be scheduled at his convenience.  Risks associated with surgery, include but are not limited to, injury of nerve or vessel, infection, DVT, hardware failure, graft failure, fracture, anesthetic complications, persistent pain or stiffness, and possible need for further surgery.  He verbalized understanding of the above mentioned procedure.  He was informed that Erlene Quan, MD would be performing the procedure.  He was given appropriate surgical packet and scheduling information.       ATTESTATION    I have taken down these notes in the presence of Dr. Oswaldo Done Elara Cocke.    Staff name:  Vassie Loll Date:  12/08/2019

## 2019-12-08 ENCOUNTER — Encounter: Admit: 2019-12-08 | Discharge: 2019-12-08

## 2019-12-08 ENCOUNTER — Ambulatory Visit: Admit: 2019-12-08 | Discharge: 2019-12-08 | Payer: BC Managed Care – PPO

## 2019-12-08 DIAGNOSIS — S83241A Other tear of medial meniscus, current injury, right knee, initial encounter: Secondary | ICD-10-CM

## 2019-12-08 DIAGNOSIS — M25561 Pain in right knee: Secondary | ICD-10-CM

## 2019-12-10 ENCOUNTER — Encounter: Admit: 2019-12-10 | Discharge: 2019-12-10

## 2019-12-10 DIAGNOSIS — S83241A Other tear of medial meniscus, current injury, right knee, initial encounter: Secondary | ICD-10-CM

## 2019-12-10 DIAGNOSIS — Z20822 Encounter for screening laboratory testing for COVID-19 virus in asymptomatic patient: Secondary | ICD-10-CM

## 2019-12-10 MED ORDER — CEFAZOLIN 1 GRAM IJ SOLR
2 g | Freq: Once | INTRAVENOUS | 0 refills
Start: 2019-12-10 — End: ?

## 2019-12-10 NOTE — Telephone Encounter
RTC to patient's wife.  Mutual surgery date selected.  Will begin scheduling process and send a detailed email.   Patient aware of COVID testing requirement.

## 2019-12-10 NOTE — Telephone Encounter
LVM for patient to RTC regarding surgery scheduling.

## 2019-12-10 NOTE — Progress Notes
Patient scheduled for Right PMM on 11-4 at [facility]  location.  Patient was advised by T.Kandice Robinsons that a representative from the surgical facility would be in contact to provide further instructions pertaining to PAT/other pertinent information.  Orders have been faxed to appropriate surgical facility and orthopedic pre-certification representative.  Surgery packet was given  to patient. Post operative appointment made for 11-12 2:40 [facility] with Onalee Hua   This information was sent to the patient via email.  Patient was asked to call [phone]  with any questions or concerns and verbalized understanding of all information.  No further action is required at this time.  COVID appt  Monday 11-1 6:10 [place]

## 2019-12-10 NOTE — Progress Notes
Surgery order received. ?CPT, ICD 10 codes, case length, patient weight added. ?Will contact patient to schedule

## 2019-12-12 ENCOUNTER — Encounter: Admit: 2019-12-12 | Discharge: 2019-12-12

## 2019-12-12 DIAGNOSIS — S83241A Other tear of medial meniscus, current injury, right knee, initial encounter: Secondary | ICD-10-CM

## 2019-12-12 DIAGNOSIS — M25561 Pain in right knee: Secondary | ICD-10-CM

## 2019-12-12 DIAGNOSIS — Z9889 Other specified postprocedural states: Secondary | ICD-10-CM

## 2019-12-12 NOTE — Telephone Encounter
Post-op physical therapy orders/protocol faxed to patients preferred location.    [facility]  Fax: [phone]

## 2019-12-15 ENCOUNTER — Ambulatory Visit: Admit: 2019-12-15 | Discharge: 2019-12-15 | Payer: BC Managed Care – PPO

## 2019-12-15 DIAGNOSIS — S83241A Other tear of medial meniscus, current injury, right knee, initial encounter: Secondary | ICD-10-CM

## 2019-12-17 ENCOUNTER — Encounter: Admit: 2019-12-17 | Discharge: 2019-12-17

## 2019-12-22 ENCOUNTER — Encounter: Admit: 2019-12-22 | Discharge: 2019-12-22

## 2019-12-22 ENCOUNTER — Encounter: Admit: 2019-12-22 | Discharge: 2019-12-23 | Payer: BC Managed Care – PPO

## 2019-12-22 DIAGNOSIS — M25561 Pain in right knee: Secondary | ICD-10-CM

## 2019-12-22 DIAGNOSIS — Z20822 Encounter for screening laboratory testing for COVID-19 virus in asymptomatic patient: Secondary | ICD-10-CM

## 2019-12-23 ENCOUNTER — Encounter: Admit: 2019-12-23 | Discharge: 2019-12-23

## 2019-12-23 NOTE — Telephone Encounter
Spoke with patient's spouse, Iona Hansen and verified full name and date of birth. Informed patient of POSITIVE COVID-19 Test Result.     Patient advised to contact their surgeon to discuss surgery due to positive results. Positive results do not cause surgery to automatically be cancelled. Surgeon and patient will discuss need to continue with procedure or reschedule on a case by case basis.     Date of Symptom Onset: no symptoms  COVID-19 Symptoms: No, making patient ineligible for therapy  Date of first COVID-19 vaccination:  Uknown  Date of second COVID-19 vaccination (if applicable):  Market researcher of vaccination:  N/A    Patient enrolled in COVID Care Companion: Not Applicable  If no or N/A, list reason:  No [place] PCP    Does patient require letter stating test result?  No    Patient's wife stated she did not believe his COVID-19 test could be positive because the patient does not leave him. She stated, I am going to go get him a rapid test. I informed Romonda to reach out to Dr. Feliberto Gottron office for further instructions.

## 2019-12-23 NOTE — Telephone Encounter
Left voicemail requesting patient return call to discuss lab result.

## 2019-12-23 NOTE — Telephone Encounter
RTC to patient's wife.  Let her know per provider, surgery will need to be rescheduled due to positive COVID results.  Patient is asymptomatic and surprised it was positive.  They wanted to get another test, advised them that that would not change the providers decision to proceed.  New surgery date selected.  Will make changes and send a detailed email.

## 2019-12-23 NOTE — Progress Notes
REVISED SURGERY INFO     Patient scheduled for  Right PMM on 11-16 at [facility] location.  Patient was advised by T.Kandice Robinsons that a representative from the surgical facility would be in contact to provide further instructions pertaining to PAT/other pertinent information.  Orders have been faxed to appropriate surgical facility and orthopedic pre-certification representative.  Surgery packet was given to patient. Post operative appointment made for 11.22 11:40 .  This information was sent to the patient via email.  Patient was asked to call [phone]  with any questions or concerns and verbalized understanding of all information.  No further action is required at this time.   NO REPEAT COVID TEST REQUIRED

## 2020-01-06 ENCOUNTER — Encounter: Admit: 2020-01-06 | Discharge: 2020-01-06

## 2020-01-06 DIAGNOSIS — M25561 Pain in right knee: Secondary | ICD-10-CM

## 2020-01-06 MED ORDER — DEXMEDETOMIDINE IN 0.9 % NACL 80 MCG/20 ML (4 MCG/ML) IV SOLN
INTRAVENOUS | 0 refills | Status: DC
Start: 2020-01-06 — End: 2020-01-06

## 2020-01-06 MED ORDER — LIDOCAINE (PF) 200 MG/10 ML (2 %) IJ SYRG
INTRAVENOUS | 0 refills | Status: DC
Start: 2020-01-06 — End: 2020-01-06

## 2020-01-06 MED ORDER — PROPOFOL INJ 10 MG/ML IV VIAL
INTRAVENOUS | 0 refills | Status: DC
Start: 2020-01-06 — End: 2020-01-06

## 2020-01-06 MED ORDER — ONDANSETRON HCL (PF) 4 MG/2 ML IJ SOLN
INTRAVENOUS | 0 refills | Status: DC
Start: 2020-01-06 — End: 2020-01-06

## 2020-01-06 MED ORDER — MIDAZOLAM 1 MG/ML IJ SOLN
INTRAVENOUS | 0 refills | Status: DC
Start: 2020-01-06 — End: 2020-01-06

## 2020-01-06 MED ORDER — DEXAMETHASONE SODIUM PHOSPHATE 4 MG/ML IJ SOLN
INTRAVENOUS | 0 refills | Status: DC
Start: 2020-01-06 — End: 2020-01-06

## 2020-01-06 MED ORDER — FENTANYL CITRATE (PF) 50 MCG/ML IJ SOLN
INTRAVENOUS | 0 refills | Status: DC
Start: 2020-01-06 — End: 2020-01-06

## 2020-01-06 MED FILL — NAPROXEN 500 MG PO TAB: 500 mg | ORAL | 30 days supply | Qty: 60 | Fill #1 | Status: CP

## 2020-01-06 MED FILL — ONDANSETRON HCL 4 MG PO TAB: 4 mg | ORAL | 4 days supply | Qty: 12 | Fill #1 | Status: CP

## 2020-01-06 MED FILL — OXYCODONE 5 MG PO TAB: 5 mg | ORAL | 5 days supply | Qty: 30 | Fill #1 | Status: CP

## 2020-01-07 ENCOUNTER — Encounter: Admit: 2020-01-07 | Discharge: 2020-01-07

## 2020-01-07 DIAGNOSIS — M25561 Pain in right knee: Secondary | ICD-10-CM

## 2020-01-09 ENCOUNTER — Encounter: Admit: 2020-01-09 | Discharge: 2020-01-09

## 2020-01-09 NOTE — Progress Notes
Surgery billing sheet completed, scanned and emailed to billing for processing.

## 2020-01-12 ENCOUNTER — Encounter: Admit: 2020-01-12 | Discharge: 2020-01-12

## 2020-01-12 ENCOUNTER — Ambulatory Visit: Admit: 2020-01-12 | Discharge: 2020-01-12 | Payer: BC Managed Care – PPO

## 2020-01-30 ENCOUNTER — Encounter: Admit: 2020-01-30 | Discharge: 2020-01-30

## 2020-01-30 ENCOUNTER — Ambulatory Visit: Admit: 2020-01-30 | Discharge: 2020-01-30 | Payer: BC Managed Care – PPO

## 2020-01-30 DIAGNOSIS — Z9889 Other specified postprocedural states: Secondary | ICD-10-CM

## 2020-01-30 DIAGNOSIS — M25561 Pain in right knee: Secondary | ICD-10-CM

## 2020-01-30 NOTE — Progress Notes
SUBJECTIVE:  Gary Clarke. presents today for post operative follow up on his Right knee arthroscopy with PMM, LR.  he is now about 3 weeks out from surgery.  He is doing well with minimal complaints other than persistent swelling.  He has been participating in PT and progressing as expected in terms of strength and ROM. Having minimal pain. Denies any adverse events.    Surgical History:   Procedure Laterality Date    ARTHROSCOPY SHOULDER WITH SUBACROMIAL DECOMPRESSION, ACROMIOPLASTY, DISTAL CLAVICLE EXCISION, BICEPS TENOTOMY, LABRAL REPAIR, MINI OPEN ROTATOR CUFF REPAIR Left 08/22/2018    Performed by Caryl Comes, MD at [facility] OR    ARTHROSCOPY KNEE WITH PARTIAL MEDIAL MENISCECTOMY AND ARTHROSCOPIC LATERAL RELEASE Right 01/06/2020    Performed by Caryl Comes, MD at [facility] OR    KNEE CARTILAGE SURGERY Left     KNEE SURGERY      Left knee    WISDOM TEETH EXTRACTION           Current Outpatient Medications:     acetaminophen (MAPAP) 325 mg tablet, Take two tablets by mouth every 6 hours as needed for Pain., Disp: , Rfl: 0    OBJECTIVE:  Vitals:    01/30/20 0833   BP: 136/73   Pulse: 69       Right knee was evaluated.  Wounds well healed. Moderate joint effusion. ROM 0-100.  Stable to varus/valgus stress. Quad MS 4/5.  Neurovascularly intact.    ASSESSMENT:    ICD-9-CM ICD-10-CM    1. S/P arthroscopy of knee  V45.89 Z98.890        2.  59 y.o. male 3 weeks S/p Right knee arthroscopy with PMM, LR    PLAN:  Recommend joint aspiration today, see procedure notes  Instructed on wound care  Ice, NSAIDs  Continue PT per protocol. Limit high impact activity  F/u in 6 weeks for repeat evaluation    Lars Pinks, PA-C

## 2020-02-24 ENCOUNTER — Encounter: Admit: 2020-02-24 | Discharge: 2020-02-24

## 2020-02-24 NOTE — Telephone Encounter
LVM for patient to RTC , need to reschedule upcoming appt.

## 2020-02-25 ENCOUNTER — Encounter: Admit: 2020-02-25 | Discharge: 2020-02-25

## 2020-03-09 ENCOUNTER — Encounter: Admit: 2020-03-09 | Discharge: 2020-03-09

## 2020-03-09 ENCOUNTER — Ambulatory Visit: Admit: 2020-03-09 | Discharge: 2020-03-10 | Payer: BC Managed Care – PPO

## 2020-03-09 DIAGNOSIS — M25561 Pain in right knee: Secondary | ICD-10-CM

## 2020-03-09 DIAGNOSIS — Z9889 Other specified postprocedural states: Principal | ICD-10-CM

## 2020-03-09 MED ORDER — ROPIVACAINE (PF) 5 MG/ML (0.5 %) IJ SOLN
5 mL | Freq: Once | INTRAMUSCULAR | 0 refills | Status: CP | PRN
Start: 2020-03-09 — End: ?

## 2020-03-09 MED ORDER — TRIAMCINOLONE ACETONIDE 40 MG/ML IJ SUSP
40 mg | Freq: Once | INTRAMUSCULAR | 0 refills | Status: CP | PRN
Start: 2020-03-09 — End: ?
  Administered 2020-03-09: 14:00:00 40 mg via INTRAMUSCULAR

## 2020-03-09 NOTE — Progress Notes
SUBJECTIVE:  Gary Clarke. presents today for post operative follow up on his Right knee arthroscopy with PMM, LR.  he is now about 3 weeks out from surgery.  He is doing well with minimal complaints other than persistent swelling.  He has completed a course of formal PT. He continues his daily exercise routine. Complains of pain along the medial joint line. Denies any adverse events.    Surgical History:   Procedure Laterality Date   ? ARTHROSCOPY SHOULDER WITH SUBACROMIAL DECOMPRESSION, ACROMIOPLASTY, DISTAL CLAVICLE EXCISION, BICEPS TENOTOMY, LABRAL REPAIR, MINI OPEN ROTATOR CUFF REPAIR Left 08/22/2018    Performed by Key, [name], MD at [facility] OR   ? ARTHROSCOPY KNEE WITH PARTIAL MEDIAL MENISCECTOMY AND ARTHROSCOPIC LATERAL RELEASE Right 01/06/2020    Performed by Caryl Comes, MD at [facility]2 OR   ? KNEE CARTILAGE SURGERY Left    ? KNEE SURGERY      Left knee   ? WISDOM TEETH EXTRACTION           Current Outpatient Medications:   ?  acetaminophen (MAPAP) 325 mg tablet, Take two tablets by mouth every 6 hours as needed for Pain., Disp: , Rfl: 0    OBJECTIVE:  Vitals:    03/09/20 0813   BP: (!) 152/91   Pulse: 71       Right knee was evaluated.  Wounds well healed. Moderate joint effusion. ROM 0-130.  Stable to varus/valgus stress. Quad MS 4/5.  Neurovascularly intact.    ASSESSMENT:    ICD-9-CM ICD-10-CM    1. S/P arthroscopy of right knee  V45.89 Z98.890        2.  60 y.o. male 9 weeks S/p Right knee arthroscopy with PMM, LR    PLAN:  Recommend joint aspiration and injection today, see procedure notes  Instructed on wound care  Ice, NSAIDs  Continue HEP. Limit high impact activity  F/u is sx do not resolve    Large Joint Drain/Inject: R knee on 03/09/2020 8:00 AM    Consent:   Consent obtained: verbal  Consent given by: patient  Risks discussed: bleeding, damage to surrounding structures, infection, pain, soft tissue reaction, skin discoloration and tendon rupture  Alternatives discussed: alternative treatment, delayed treatment and no treatment  Discussed with patient the purpose of the treatment/procedure, other ways of treating my condition, including no treatment/ procedure and the risks and benefits of the alternatives. Patient has decided to proceed with treatment/procedure.        Universal Protocol:  Relevant documents: relevant documents present and verified  Test results: test results available and properly labeled  Imaging studies: imaging studies available  Required items: required blood products, implants, devices, and special equipment available  Site marked: the operative site was marked  Patient identity confirmed: Patient identify confirmed verbally with patient.        Time out: Immediately prior to procedure a time out was called to verify the correct patient, procedure, equipment, support staff and site/side marked as required      Procedures Details:  Procedure Peformed: Injection Only  Indications: pain and diagnostic evaluation  Details:Prep: alcohol and povidone-iodine  Local anesthetic: ethyl chloride spray   22 G needle, superolateral approachMedications: 40 mg triamcinolone acetonide 40 mg/mL; 5 mL ropivacaine (PF) 0.5% (5 mg/mL)  Outcome: tolerated well, no immediate complications        Lars Pinks, PA-C

## 2020-04-29 NOTE — Progress Notes
SUBJECTIVE:  [place]. presents to the office today in f/u of his right knee.  He rates his pain 7/10 today.  He is approximately four months s/p right knee video arthroscopy, partial medial meniscectomy, chondroplasty of the medial femoral condyle, and arthroscopic lateral release.  He received a right knee cortisone injection, performed by Judene Companion, PA-C on January 18th, which he feels provided minimal relief.  More recently, over the past 1-2 months, he has noted increasing pain and swelling, specifically localized along the medial aspect of his knee today.  Denies mechanical symptoms, such as catching or locking, as well as instability.  His pain is made worse with weightbearing activities.  He has completed his formal course of PT, but has been performing a daily execise routine.  Denies new injury.    OBJECTIVE:  Vitals:    04/30/20 1216   BP: 136/85   Pulse: 65     PE:  Right knee was evaluated.  Well-healed incisions.  ROM is 0-130 degrees.  2+ effusion.  Stable to varus/valgus stress.  Positive medial joint line tenderness.  Negative lateral joint line tenderness.  Moderate quad weakness.  Neurovascularly intact.    ASSESSMENT:    ICD-9-CM ICD-10-CM    1. S/P arthroscopy of knee  V45.89 Z98.890 [place] [employer] LARGE JOINT DRAIN/INJECT      KNEE BRACE   2. Acute medial meniscus tear of right knee, initial encounter  836.0 S83.241A [place] [employer] LARGE JOINT DRAIN/INJECT      KNEE BRACE   3. Pain and swelling of right knee  719.46 M25.561 [place] [employer] LARGE JOINT DRAIN/INJECT    719.06 M25.461 KNEE BRACE   4. Maltracking of right patella  719.86 M22.8X1 [place] [employer] LARGE JOINT DRAIN/INJECT      KNEE BRACE   5. Osteoarthritis of right knee, unspecified osteoarthritis type  715.96 M17.11 [place] [employer] LARGE JOINT DRAIN/INJECT      KNEE BRACE       PLAN:  -New PT script with protocol, with incorporation of BFR, particularly focusing on quad strengthening exercises.  -[facility] script for a medial unloader brace, due to disproportionate calf to thigh ratio.  -We discussed the option and benefits of Viscosupplementation in the future.  -Activity as tolerated.  -Ice and compression (neoprene sleeve and/or ACE bandage), as needed.  -NSAIDs  -F/u in four weeks.       ATTESTATION    I have taken down these notes in the presence of Dr. Oswaldo Done Mahum Betten.    Staff name:  Vassie Loll Date:  04/30/2020

## 2020-04-30 ENCOUNTER — Ambulatory Visit: Admit: 2020-04-30 | Discharge: 2020-05-01 | Payer: BC Managed Care – PPO

## 2020-04-30 ENCOUNTER — Encounter: Admit: 2020-04-30 | Discharge: 2020-04-30

## 2020-04-30 DIAGNOSIS — M25561 Pain in right knee: Secondary | ICD-10-CM

## 2020-04-30 DIAGNOSIS — Z9889 Other specified postprocedural states: Secondary | ICD-10-CM

## 2020-05-01 DIAGNOSIS — M1711 Unilateral primary osteoarthritis, right knee: Secondary | ICD-10-CM

## 2020-05-01 DIAGNOSIS — M25461 Effusion, right knee: Secondary | ICD-10-CM

## 2020-05-01 DIAGNOSIS — S83241A Other tear of medial meniscus, current injury, right knee, initial encounter: Secondary | ICD-10-CM

## 2020-05-01 DIAGNOSIS — M228X1 Other disorders of patella, right knee: Secondary | ICD-10-CM

## 2020-06-07 ENCOUNTER — Encounter: Admit: 2020-06-07 | Discharge: 2020-06-07

## 2020-06-07 ENCOUNTER — Ambulatory Visit: Admit: 2020-06-07 | Discharge: 2020-06-07 | Payer: BC Managed Care – PPO

## 2020-06-07 DIAGNOSIS — M25561 Pain in right knee: Secondary | ICD-10-CM

## 2020-06-07 DIAGNOSIS — M1711 Unilateral primary osteoarthritis, right knee: Secondary | ICD-10-CM

## 2020-06-07 DIAGNOSIS — Z9889 Other specified postprocedural states: Secondary | ICD-10-CM

## 2020-06-07 DIAGNOSIS — S83241A Other tear of medial meniscus, current injury, right knee, initial encounter: Secondary | ICD-10-CM

## 2020-06-07 DIAGNOSIS — M228X1 Other disorders of patella, right knee: Secondary | ICD-10-CM

## 2020-06-07 NOTE — Patient Instructions
General Instructions:  Vincent Key MD and David Silkman PA-C     How to reach me:   Please send a MyChart message to the Orthopedic Surgery clinic or call our nursing line at [phone].   Fax number: [phone]   How to get a medication refill:  Please contact your pharmacy directly to request medication refills. Please allow 48 hours.      How to receive your test results:  If you have signed up for MyChart, you will receive your test results this way.  Results will be reviewed with a provider in the office.     Scheduling:  Our Scheduling phone number is [phone].     Appointment Reminders on your cell phone: Make sure we have your cell phone number, and Text [place] to [phone].            Please contact our scheduling department to schedule your follow up visit at [phone].

## 2020-06-16 NOTE — Progress Notes
SUBJECTIVE:    Gary Clarke. presents today for his second Orthovisc injection into the right knee.  He tolerated the last injection well.  He denies any adverse events.    Objective:  PE:  Unchanged    Impression:       ICD-9-CM ICD-10-CM    1. Osteoarthritis of right knee, unspecified osteoarthritis type  715.96 M17.11        Plan:  Proceed with the second injection.  F/u in 1 week for the third injection.    Informed consent was obtained and the procedure was explained.  Under sterile technique, a prefilled syringe of Orthovisc and cortisone was injected into the suprapatellar pouch of the right knee.  [name] tolerated the injection well and ambulated independently out of the office.    Informed consent was obtained and risks/benefits of the injection were reviewed.  Under sterile technique, 80 mg of Kenalog and 5 cc of Ropivacaine were injected into the suprapatellar pouch of the right knee.  [name] and ambulated independently out of the office.    Under sterile technique, 28 ccs of serous fluid was aspirated from his right knee today. [name] and ambulated independently out of the office.       ATTESTATION    I have taken down these notes in the presence of Dr. Oswaldo Done Esraa Seres.    Staff name:  Vassie Loll Date:  06/18/2020

## 2020-06-18 ENCOUNTER — Encounter: Admit: 2020-06-18 | Discharge: 2020-06-18

## 2020-06-18 ENCOUNTER — Ambulatory Visit: Admit: 2020-06-18 | Discharge: 2020-06-19 | Payer: BC Managed Care – PPO

## 2020-06-18 DIAGNOSIS — M25561 Pain in right knee: Secondary | ICD-10-CM

## 2020-06-18 MED ORDER — TRIAMCINOLONE ACETONIDE 40 MG/ML IJ SUSP
80 mg | Freq: Once | INTRAMUSCULAR | 0 refills | Status: CP | PRN
Start: 2020-06-18 — End: ?
  Administered 2020-06-18: 18:00:00 80 mg via INTRAMUSCULAR

## 2020-06-18 MED ORDER — SODIUM HYALURONATE (VISCOSUP) 30 MG/2 ML IX SYRG
30 mg | Freq: Once | INTRA_ARTICULAR | 0 refills | Status: CP | PRN
Start: 2020-06-18 — End: ?
  Administered 2020-06-18: 18:00:00 30 mg via INTRA_ARTICULAR

## 2020-06-18 MED ORDER — ROPIVACAINE (PF) 5 MG/ML (0.5 %) IJ SOLN
5 mL | Freq: Once | INTRAMUSCULAR | 0 refills | Status: CP | PRN
Start: 2020-06-18 — End: ?
  Administered 2020-06-18: 18:00:00 5 mL via INTRAMUSCULAR

## 2020-06-18 NOTE — Patient Instructions
General Instructions:  Vincent Key MD and David Silkman PA-C     How to reach me:   Please send a MyChart message to the Orthopedic Surgery clinic or call our nursing line at [phone].   Fax number: [phone]   How to get a medication refill:  Please contact your pharmacy directly to request medication refills. Please allow 48 hours.      How to receive your test results:  If you have signed up for MyChart, you will receive your test results this way.  Results will be reviewed with a provider in the office.     Scheduling:  Our Scheduling phone number is [phone].     Appointment Reminders on your cell phone: Make sure we have your cell phone number, and Text  to [phone].            Please contact our scheduling department to schedule your follow up visit at [phone].

## 2020-06-18 NOTE — Procedures
Large Joint Drain/Inject: R knee on 06/18/2020 12:50 PM    Consent:   Consent obtained: verbal  Consent given by: patient  Risks discussed: damage to surrounding structures, infection, pain, skin discoloration and soft tissue reaction  Alternatives discussed: alternative treatment, delayed treatment and no treatment  Discussed with patient the purpose of the treatment/procedure, other ways of treating my condition, including no treatment/ procedure and the risks and benefits of the alternatives. Patient has decided to proceed with treatment/procedure.        Universal Protocol:  Relevant documents: relevant documents present and verified  Test results: test results available and properly labeled  Imaging studies: imaging studies available  Required items: required blood products, implants, devices, and special equipment available  Site marked: the operative site was marked  Patient identity confirmed: Patient identify confirmed verbally with patient.        Time out: Immediately prior to procedure a "time out" was called to verify the correct patient, procedure, equipment, support staff and site/side marked as required      Procedures Details:  Procedure Peformed: Injection and Aspiration  Indications: pain  Details:Prep: povidone-iodine  Local anesthetic: ethyl chloride spray   18 G needle, superolateral approachMedications: 30 mg sodium hyaluronate (ORTHOVISC) 30 mg/2 mL; 5 mL ropivacaine (PF) 0.5% (5 mg/mL); 80 mg triamcinolone acetonide 40 mg/mL  Aspirate: 28 mL serous  Outcome: tolerated well, no immediate complications

## 2020-06-19 DIAGNOSIS — S83241A Other tear of medial meniscus, current injury, right knee, initial encounter: Secondary | ICD-10-CM

## 2020-06-19 DIAGNOSIS — Z9889 Other specified postprocedural states: Secondary | ICD-10-CM

## 2020-06-19 DIAGNOSIS — M25561 Pain in right knee: Secondary | ICD-10-CM

## 2020-06-19 DIAGNOSIS — M1711 Unilateral primary osteoarthritis, right knee: Secondary | ICD-10-CM

## 2020-06-19 DIAGNOSIS — M228X1 Other disorders of patella, right knee: Secondary | ICD-10-CM

## 2020-06-19 DIAGNOSIS — M25461 Effusion, right knee: Secondary | ICD-10-CM

## 2020-06-25 ENCOUNTER — Encounter: Admit: 2020-06-25 | Discharge: 2020-06-25

## 2020-06-25 ENCOUNTER — Ambulatory Visit: Admit: 2020-06-25 | Discharge: 2020-06-25 | Payer: BC Managed Care – PPO

## 2020-06-25 DIAGNOSIS — M25561 Pain in right knee: Secondary | ICD-10-CM

## 2020-06-25 DIAGNOSIS — M1711 Unilateral primary osteoarthritis, right knee: Secondary | ICD-10-CM

## 2020-06-25 NOTE — Patient Instructions
General Instructions:  Vincent Key MD and David Silkman PA-C     How to reach me:   Please send a MyChart message to the Orthopedic Surgery clinic or call our nursing line at [phone].   Fax number: [phone]   How to get a medication refill:  Please contact your pharmacy directly to request medication refills. Please allow 48 hours.      How to receive your test results:  If you have signed up for MyChart, you will receive your test results this way.  Results will be reviewed with a provider in the office.     Scheduling:  Our Scheduling phone number is [phone].     Appointment Reminders on your cell phone: Make sure we have your cell phone number, and Text [place] to [phone].            Please contact our scheduling department to schedule your follow up visit at [phone].

## 2020-12-28 ENCOUNTER — Ambulatory Visit: Admit: 2020-12-28 | Discharge: 2020-12-28

## 2020-12-28 ENCOUNTER — Encounter: Admit: 2020-12-28 | Discharge: 2020-12-28

## 2020-12-28 ENCOUNTER — Ambulatory Visit: Admit: 2020-12-28 | Discharge: 2020-12-28 | Payer: BC Managed Care – PPO

## 2020-12-28 DIAGNOSIS — M25562 Pain in left knee: Secondary | ICD-10-CM

## 2020-12-28 DIAGNOSIS — M25561 Pain in right knee: Secondary | ICD-10-CM

## 2020-12-28 DIAGNOSIS — M2392 Unspecified internal derangement of left knee: Secondary | ICD-10-CM

## 2020-12-28 NOTE — Patient Instructions
General Instructions:  Vincent Key MD and David Silkman PA-C    How to reach me:   Please send a MyChart message to the Orthopedic Surgery clinic or call our nursing line at [phone].   Fax number: [phone]  How to get a medication refill:  Please contact your pharmacy directly to request medication refills. Please allow 48 hours.     How to receive your test results:  If you have signed up for MyChart, you will receive your test results this way.  Results will be reviewed with a provider in the office.    Scheduling:  Our Scheduling phone number is [phone].    Communication preferences can be managed in MyChart to ensure you receive important appointment notifications              Please contact our scheduling department to schedule your follow up visit at [phone].

## 2021-01-26 ENCOUNTER — Encounter: Admit: 2021-01-26 | Discharge: 2021-01-26

## 2021-01-26 DIAGNOSIS — R69 Illness, unspecified: Secondary | ICD-10-CM

## 2021-02-10 ENCOUNTER — Encounter: Admit: 2021-02-10 | Discharge: 2021-02-10

## 2021-02-15 ENCOUNTER — Encounter: Admit: 2021-02-15 | Discharge: 2021-02-15

## 2021-02-15 ENCOUNTER — Ambulatory Visit: Admit: 2021-02-15 | Discharge: 2021-02-15 | Payer: BC Managed Care – PPO

## 2021-02-15 DIAGNOSIS — M25561 Pain in right knee: Secondary | ICD-10-CM

## 2021-02-15 DIAGNOSIS — M1712 Unilateral primary osteoarthritis, left knee: Principal | ICD-10-CM

## 2021-02-15 NOTE — Patient Instructions
General Instructions:  Vincent Key MD and David Silkman PA-C    How to reach me:   Please send a MyChart message to the Orthopedic Surgery clinic or call our nursing line at [phone].   Fax number: [phone]  How to get a medication refill:  Please contact your pharmacy directly to request medication refills. Please allow 48 hours.     How to receive your test results:  If you have signed up for MyChart, you will receive your test results this way.  Results will be reviewed with a provider in the office.    Scheduling:  Our Scheduling phone number is [phone].    Communication preferences can be managed in MyChart to ensure you receive important appointment notifications              Please contact our scheduling department to schedule your follow up visit at [phone].

## 2021-04-01 IMAGING — DX CHEST - 2 VIEW
2 series · 2 of 2 positions shown · non-contrast
Comparison: 07/01/2018

CLINICAL DATA: Bilateral lower extremity swelling, chronic cough

EXAM:
CHEST - 2 VIEW

[chest pa]
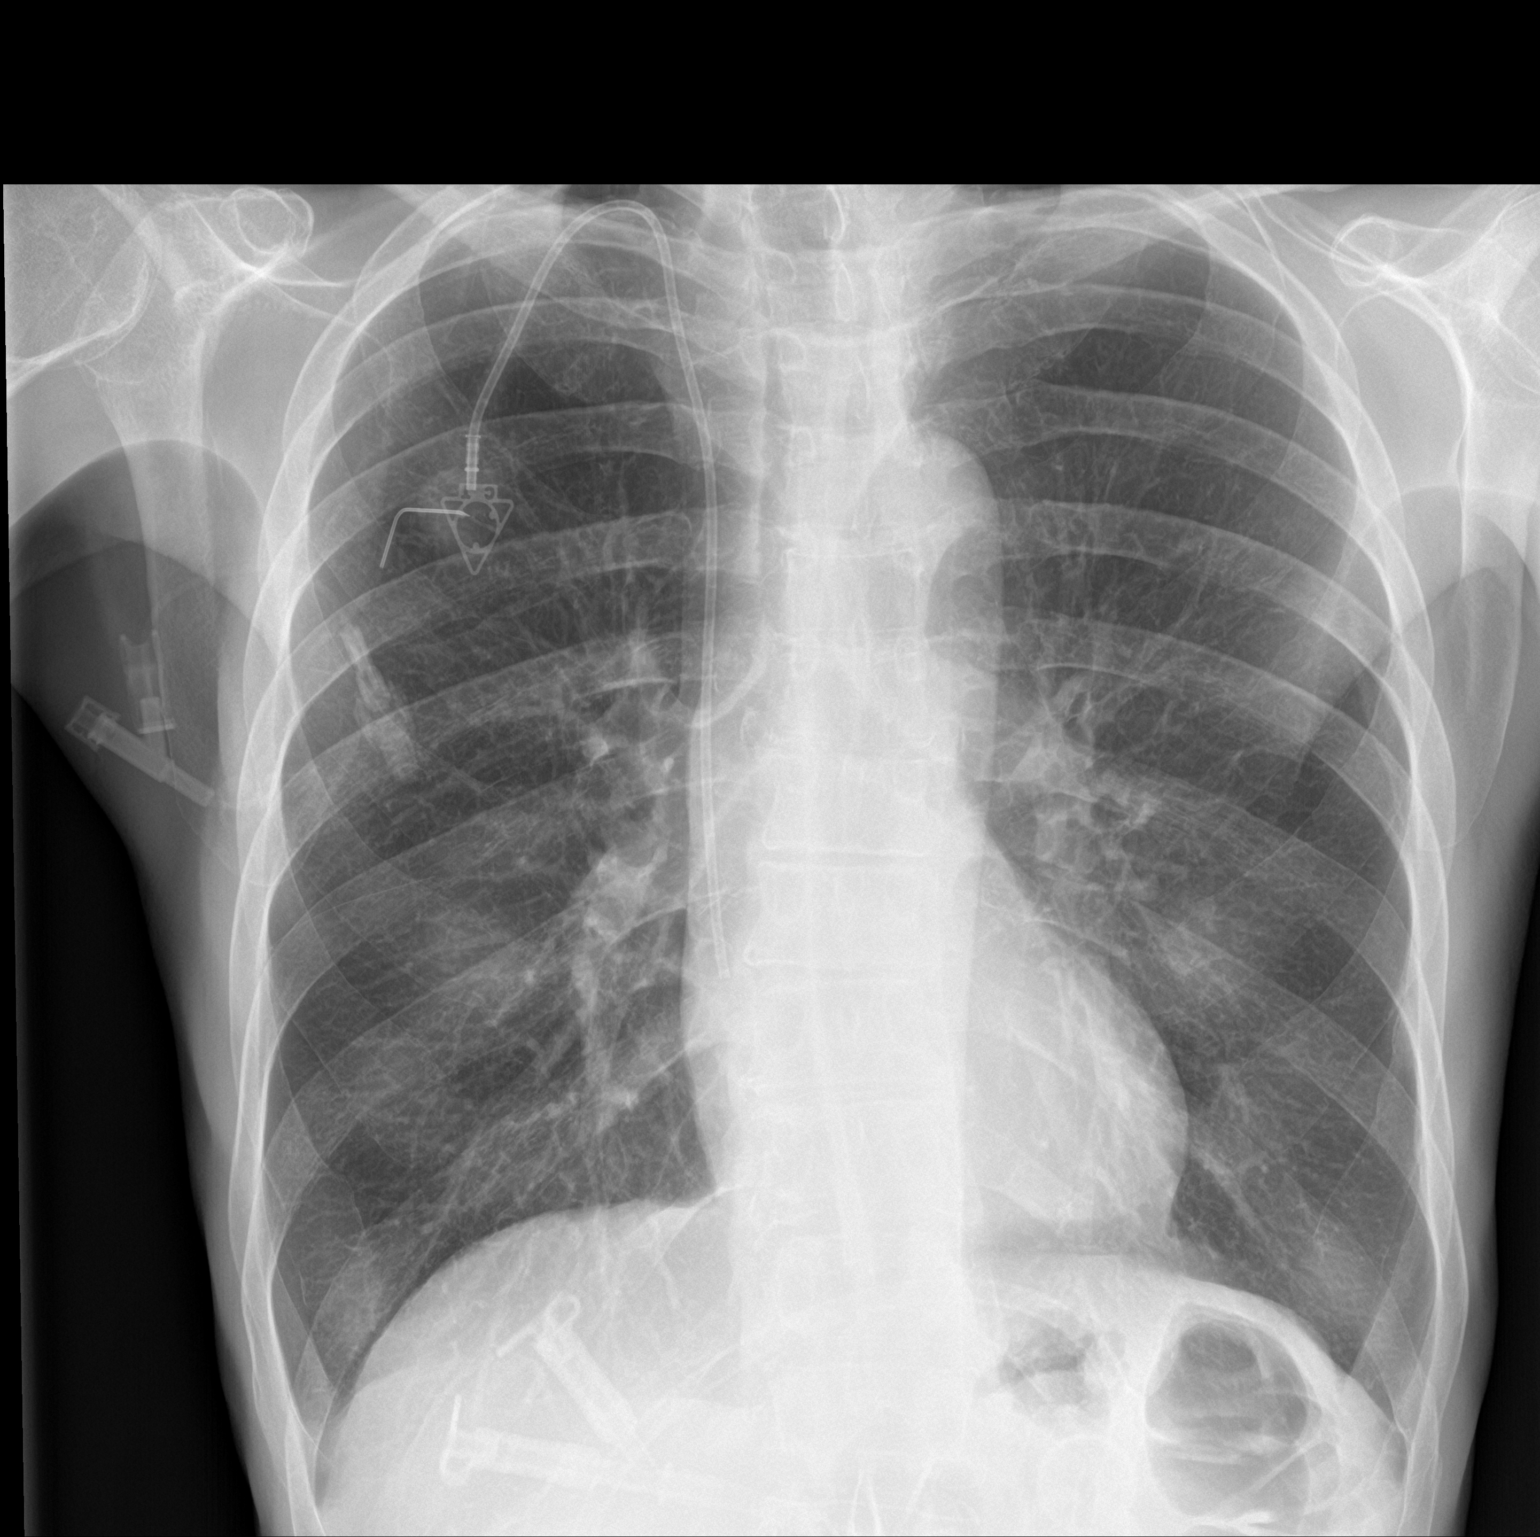

[chest lat]
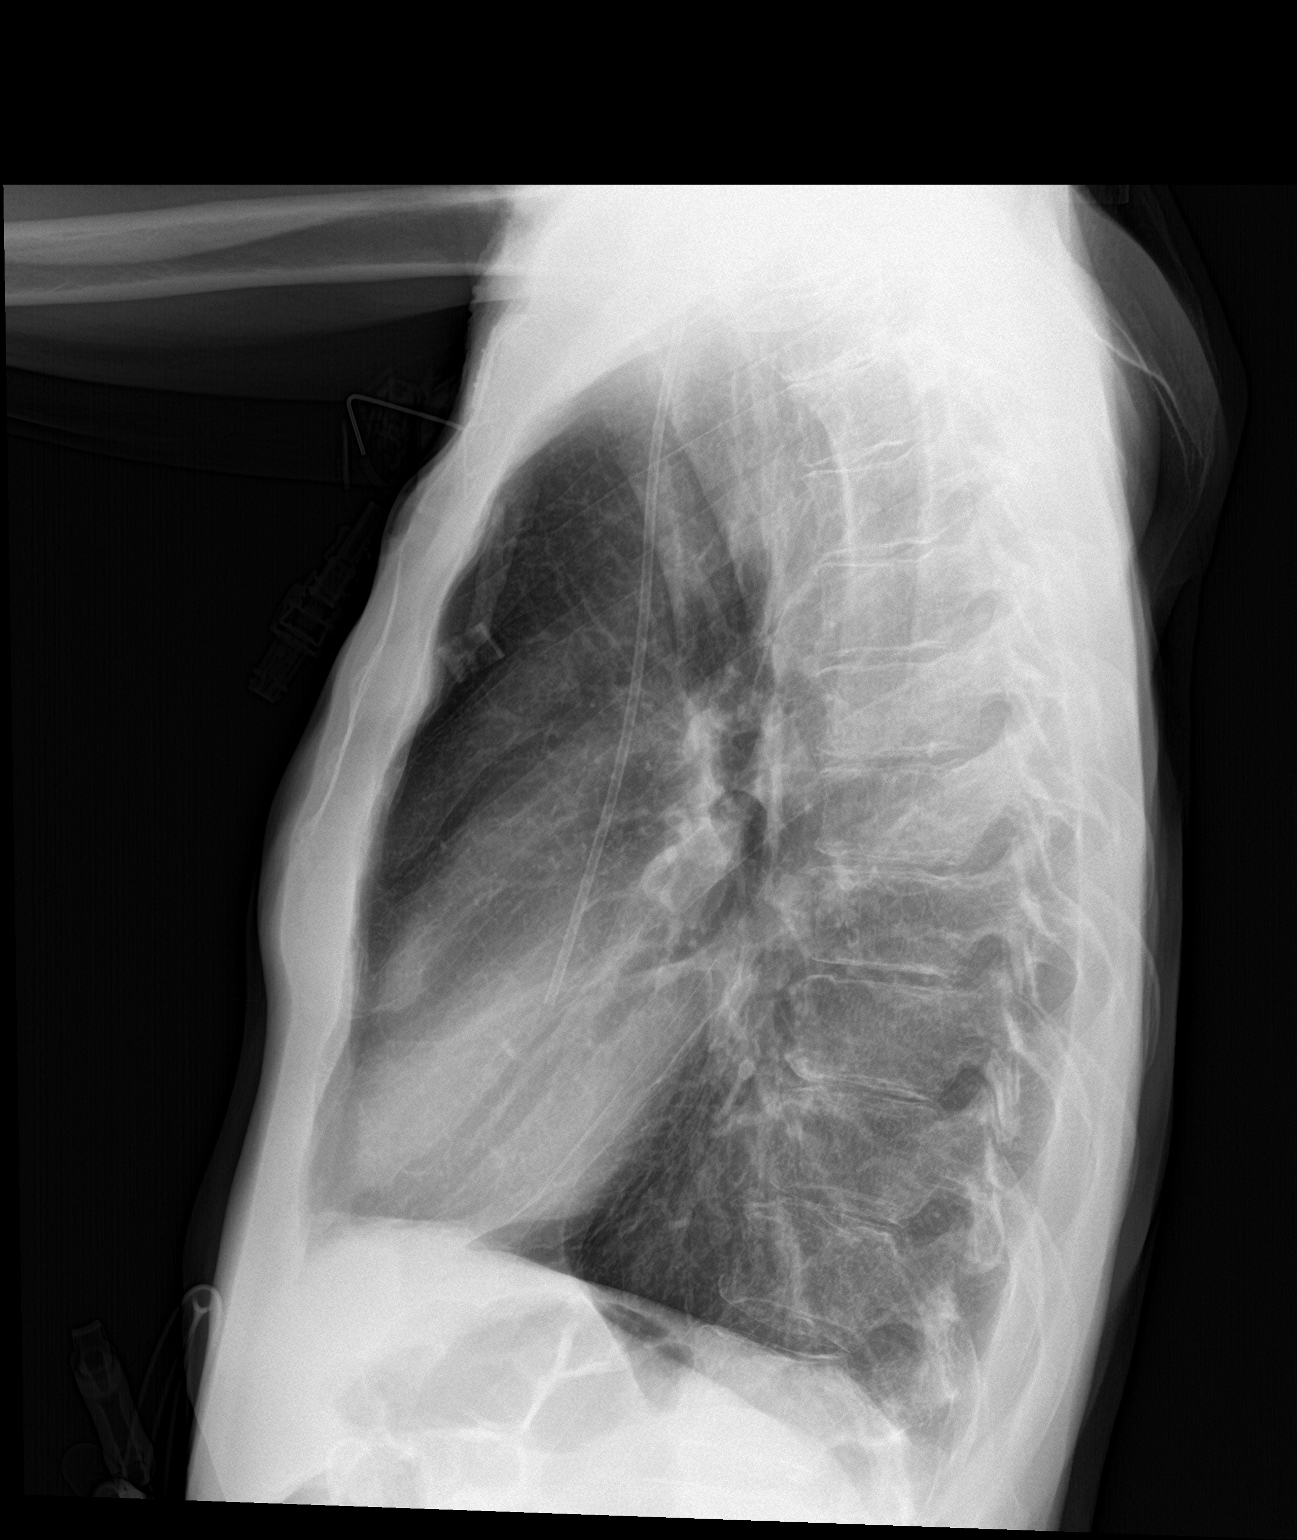

[2 of 2 positions shown; findings below may reference images not displayed]

FINDINGS: The heart size and mediastinal contours are within normal limits.
Hyperinflation and probable emphysema. The visualized skeletal
structures are unremarkable.
IMPRESSION: No acute abnormality of the lungs. Hyperinflation and probable
emphysema.

## 2021-08-08 ENCOUNTER — Encounter: Admit: 2021-08-08 | Discharge: 2021-08-08

## 2021-08-09 ENCOUNTER — Encounter: Admit: 2021-08-09 | Discharge: 2021-08-09

## 2021-08-09 ENCOUNTER — Ambulatory Visit: Admit: 2021-08-09 | Discharge: 2021-08-09 | Payer: BC Managed Care – PPO

## 2021-08-09 DIAGNOSIS — M25561 Pain in right knee: Secondary | ICD-10-CM

## 2021-08-09 DIAGNOSIS — M1712 Unilateral primary osteoarthritis, left knee: Secondary | ICD-10-CM

## 2021-08-09 MED ORDER — TRIAMCINOLONE ACETONIDE 40 MG/ML IJ SUSP
80 mg | Freq: Once | INTRAMUSCULAR | 0 refills | Status: CP | PRN
Start: 2021-08-09 — End: ?
  Administered 2021-08-09: 20:00:00 80 mg via INTRAMUSCULAR

## 2021-08-09 MED ORDER — ROPIVACAINE (PF) 5 MG/ML (0.5 %) IJ SOLN
5 mL | Freq: Once | INTRAMUSCULAR | 0 refills | Status: CP | PRN
Start: 2021-08-09 — End: ?
  Administered 2021-08-09: 20:00:00 5 mL via INTRAMUSCULAR

## 2021-08-09 NOTE — Progress Notes
Date of Service: 08/09/2021     Subjective:          Gary Clarke. is a 61 y.o. male who presents to he clinic today for follow up of his left knee pain. The patient has known DJD in the left knee and has been receiving cortisone injections, last injection was 02/15/22. he reports approximately 4-5 months of adequate pain relief however the pain has now returned to baseline. he has been icing, resting the knee, and modifying activity levels however the pain has persisted. The patient is interested in possibly repeating a knee injection at today's visit. Patient denies any new injury or trauma to the knee.    Review of Systems  Positive for arthralgias, joint swelling  Negative for numbness, weakness, instability    Medical History:   Diagnosis Date   ? Pain in right knee      Surgical History:   Procedure Laterality Date   ? ARTHROSCOPY SHOULDER WITH SUBACROMIAL DECOMPRESSION, ACROMIOPLASTY, DISTAL CLAVICLE EXCISION, BICEPS TENOTOMY, LABRAL REPAIR, MINI OPEN ROTATOR CUFF REPAIR Left 08/22/2018    Performed by Key, [name], MD at [facility] OR   ? ARTHROSCOPY KNEE WITH PARTIAL MEDIAL MENISCECTOMY AND ARTHROSCOPIC LATERAL RELEASE Right 01/06/2020    Performed by Caryl Comes, MD at [facility] OR   ? KNEE CARTILAGE SURGERY Left    ? KNEE SURGERY      Left knee   ? WISDOM TEETH EXTRACTION       ALLERGIES:  Patient has no known allergies.  No current outpatient medications on file.    Objective:         Vitals:    08/09/21 1504   BP: (!) 146/88   Pulse: 64   PainSc: Eight   Weight: 91.6 kg (202 lb)   Height: 180.3 cm (5' 11)     Body mass index is 28.17 kg/m?Marland Kitchen     Physical Exam  Ortho Exam  Left knee was evaluated. Skin intact. large effusion.  ROM 0-130 degrees.  Stable to varus/valgus stress.  Negative retropatellar crepitus noted.  Quad MS 5/5.  Neurovascularly intact.       Assessment and Plan:  Assessment: 61 y.o. male with:    ICD-9-CM ICD-10-CM    1. Primary osteoarthritis of left knee  715.16 M17.12 Plan:  Given the severity of the patient's symptoms and failure of other conservative measures I would recommend a repeat corticosteroid injection into the left knee at today's visit.  The patient agrees with this plan and wishes to proceed.  Subsequently under sterile technique 80 mg of Kenalog and 5 mL of 0.5% ropivacaine were injected into the suprapatellar pouch of the left knee.  The patient tolerated procedure well  with no immediate complications.  Patient was educated regarding postinjection care.  Please see attached procedure documentation for more detailed information.  Continue ice and NSAIDs as needed.  Counseled regarding low impact strength exercises.  Patient may follow-up in 3 months for repeat injection or as needed.    Large Joint Drain/Inject: L knee on 08/09/2021 3:00 PM    Consent:   Consent obtained: verbal  Consent given by: patient  Risks discussed: bleeding, infection, pain, skin discoloration, soft tissue reaction and tendon rupture  Alternatives discussed: alternative treatment, delayed treatment and no treatment  Discussed with patient the purpose of the treatment/procedure, other ways of treating my condition, including no treatment/ procedure and the risks and benefits of the alternatives. Patient has decided to proceed  with treatment/procedure.        Universal Protocol:  Relevant documents: relevant documents present and verified  Test results: test results available and properly labeled  Imaging studies: imaging studies available  Required items: required blood products, implants, devices, and special equipment available  Site marked: the operative site was marked  Patient identity confirmed: Patient identify confirmed verbally with patient.        Time out: Immediately prior to procedure a time out was called to verify the correct patient, procedure, equipment, support staff and site/side marked as required      Procedures Details:  Procedure Peformed: Injection and Aspiration  Indications: pain and diagnostic evaluation  Details:Prep: alcohol and povidone-iodine  Local anesthetic: ethyl chloride spray   18 G needle, superolateral approachMedications: 80 mg triamcinolone acetonide 40 mg/mL; 5 mL ROPivacaine (PF) 0.5% (5 mg/mL)  Aspirate: 50 mL serous and yellow  Outcome: tolerated well, no immediate complications          Lars Pinks, PA-C

## 2021-08-22 ENCOUNTER — Encounter: Admit: 2021-08-22 | Discharge: 2021-08-22

## 2021-08-22 ENCOUNTER — Ambulatory Visit: Admit: 2021-08-22 | Discharge: 2021-08-22 | Payer: BC Managed Care – PPO

## 2021-08-22 DIAGNOSIS — M25561 Pain in right knee: Secondary | ICD-10-CM

## 2021-08-22 DIAGNOSIS — M1712 Unilateral primary osteoarthritis, left knee: Secondary | ICD-10-CM

## 2021-08-22 NOTE — Patient Instructions
General Instructions:  Vincent Key MD and David Silkman PA-C    How to reach me:   Please send a MyChart message to the Orthopedic Surgery clinic or call our nursing line at [phone].   Fax number: [phone]  How to get a medication refill:  Please contact your pharmacy directly to request medication refills. Please allow 48 hours.     How to receive your test results:  If you have signed up for MyChart, you will receive your test results this way.  Results will be reviewed with a provider in the office.    Scheduling:  Our Scheduling phone number is [phone].    Communication preferences can be managed in MyChart to ensure you receive important appointment notifications              Please contact our scheduling department to schedule your follow up visit at [phone].

## 2021-08-31 ENCOUNTER — Encounter: Admit: 2021-08-31 | Discharge: 2021-08-31

## 2021-08-31 DIAGNOSIS — Z01818 Encounter for other preprocedural examination: Secondary | ICD-10-CM

## 2021-08-31 DIAGNOSIS — M1712 Unilateral primary osteoarthritis, left knee: Secondary | ICD-10-CM

## 2021-08-31 MED ORDER — CEFAZOLIN INJ 1GM IVP
2 g | Freq: Once | INTRAVENOUS | 0 refills
Start: 2021-08-31 — End: ?

## 2021-08-31 MED ORDER — OXYCODONE SR 10 MG PO 12HR TABLET
10 mg | Freq: Once | ORAL | 0 refills
Start: 2021-08-31 — End: ?

## 2021-08-31 MED ORDER — CELECOXIB 200 MG PO CAP
200 mg | Freq: Once | ORAL | 0 refills
Start: 2021-08-31 — End: ?

## 2021-08-31 MED ORDER — ACETAMINOPHEN 500 MG PO TAB
1000 mg | Freq: Once | ORAL | 0 refills
Start: 2021-08-31 — End: ?

## 2021-08-31 MED ORDER — LACTATED RINGERS IV SOLP
1000 mL | INTRAVENOUS | 0 refills
Start: 2021-08-31 — End: ?

## 2021-08-31 NOTE — Telephone Encounter
LVM for patient to RTC regarding surgery scheduling.

## 2021-09-02 ENCOUNTER — Encounter: Admit: 2021-09-02 | Discharge: 2021-09-02

## 2021-09-02 NOTE — Progress Notes
F/U message to patient via email regarding surgery scheduling.

## 2021-09-08 ENCOUNTER — Encounter: Admit: 2021-09-08 | Discharge: 2021-09-08

## 2021-09-08 NOTE — Progress Notes
Surgery order scanned to [address] drive surgery recommended folder.  No call back/email response from patient.

## 2021-09-16 ENCOUNTER — Encounter: Admit: 2021-09-16 | Discharge: 2021-09-16

## 2021-09-16 NOTE — Progress Notes
Email received from patient, Hello.  Please cancel me in your system for knee replacement surgery.  I'm going another option.  Thank you     Gary Clarke

## 2021-10-12 ENCOUNTER — Encounter: Admit: 2021-10-12 | Discharge: 2021-10-12

## 2021-10-12 DIAGNOSIS — M1712 Unilateral primary osteoarthritis, left knee: Secondary | ICD-10-CM

## 2022-10-03 ENCOUNTER — Encounter: Admit: 2022-10-03 | Discharge: 2022-10-03

## 2022-10-03 DIAGNOSIS — M25511 Pain in right shoulder: Secondary | ICD-10-CM

## 2022-10-03 NOTE — Telephone Encounter
Left message with patient that they will obtain a right shoulder xray prior to seeing Dr. Freada Bergeron. Left direct number at [phone].

## 2022-10-09 ENCOUNTER — Encounter: Admit: 2022-10-09 | Discharge: 2022-10-09
